# Patient Record
Sex: Female | Born: 1970 | Race: White | Hispanic: No | Smoking: Current every day smoker
Health system: Southern US, Community
[De-identification: ages and names within clinical notes are randomized; demographics above are authoritative.]

## PROBLEM LIST (undated history)

## (undated) DIAGNOSIS — N809 Endometriosis, unspecified: Secondary | ICD-10-CM

## (undated) DIAGNOSIS — M810 Age-related osteoporosis without current pathological fracture: Secondary | ICD-10-CM

## (undated) HISTORY — PX: KIDNEY SURGERY: SHX687

## (undated) HISTORY — PX: ABDOMINAL HYSTERECTOMY: SHX81

## (undated) HISTORY — PX: TUBAL LIGATION: SHX77

## (undated) HISTORY — PX: TONSILLECTOMY: SUR1361

---

## 1998-01-02 ENCOUNTER — Emergency Department (HOSPITAL_COMMUNITY): Admission: EM | Admit: 1998-01-02 | Discharge: 1998-01-02 | Payer: Self-pay | Admitting: Emergency Medicine

## 1998-03-07 ENCOUNTER — Other Ambulatory Visit: Admission: RE | Admit: 1998-03-07 | Discharge: 1998-03-07 | Payer: Self-pay | Admitting: Obstetrics and Gynecology

## 1998-03-20 ENCOUNTER — Other Ambulatory Visit: Admission: RE | Admit: 1998-03-20 | Discharge: 1998-03-20 | Payer: Self-pay | Admitting: Obstetrics and Gynecology

## 1998-09-10 ENCOUNTER — Encounter: Payer: Self-pay | Admitting: Emergency Medicine

## 1998-09-10 ENCOUNTER — Emergency Department (HOSPITAL_COMMUNITY): Admission: EM | Admit: 1998-09-10 | Discharge: 1998-09-10 | Payer: Self-pay | Admitting: Emergency Medicine

## 1998-10-20 ENCOUNTER — Emergency Department (HOSPITAL_COMMUNITY): Admission: EM | Admit: 1998-10-20 | Discharge: 1998-10-20 | Payer: Self-pay | Admitting: Emergency Medicine

## 1999-01-25 ENCOUNTER — Other Ambulatory Visit: Admission: RE | Admit: 1999-01-25 | Discharge: 1999-01-25 | Payer: Self-pay | Admitting: Obstetrics and Gynecology

## 1999-02-17 ENCOUNTER — Inpatient Hospital Stay (HOSPITAL_COMMUNITY): Admission: EM | Admit: 1999-02-17 | Discharge: 1999-02-19 | Payer: Self-pay | Admitting: Emergency Medicine

## 1999-03-05 ENCOUNTER — Encounter: Admission: RE | Admit: 1999-03-05 | Discharge: 1999-03-05 | Payer: Self-pay | Admitting: Internal Medicine

## 1999-03-08 ENCOUNTER — Encounter: Payer: Self-pay | Admitting: Emergency Medicine

## 1999-03-08 ENCOUNTER — Emergency Department (HOSPITAL_COMMUNITY): Admission: EM | Admit: 1999-03-08 | Discharge: 1999-03-08 | Payer: Self-pay | Admitting: Emergency Medicine

## 1999-03-14 ENCOUNTER — Emergency Department (HOSPITAL_COMMUNITY): Admission: EM | Admit: 1999-03-14 | Discharge: 1999-03-14 | Payer: Self-pay | Admitting: Emergency Medicine

## 1999-03-14 ENCOUNTER — Encounter: Payer: Self-pay | Admitting: Emergency Medicine

## 1999-03-15 ENCOUNTER — Emergency Department (HOSPITAL_COMMUNITY): Admission: EM | Admit: 1999-03-15 | Discharge: 1999-03-15 | Payer: Self-pay | Admitting: Emergency Medicine

## 1999-03-16 ENCOUNTER — Encounter: Payer: Self-pay | Admitting: Emergency Medicine

## 1999-04-24 ENCOUNTER — Encounter: Admission: RE | Admit: 1999-04-24 | Discharge: 1999-04-24 | Payer: Self-pay | Admitting: Internal Medicine

## 1999-06-20 ENCOUNTER — Ambulatory Visit (HOSPITAL_COMMUNITY): Admission: RE | Admit: 1999-06-20 | Discharge: 1999-06-20 | Payer: Self-pay | Admitting: Obstetrics and Gynecology

## 1999-06-20 ENCOUNTER — Encounter (INDEPENDENT_AMBULATORY_CARE_PROVIDER_SITE_OTHER): Payer: Self-pay

## 1999-07-06 ENCOUNTER — Encounter: Payer: Self-pay | Admitting: Obstetrics and Gynecology

## 1999-07-06 ENCOUNTER — Encounter: Admission: RE | Admit: 1999-07-06 | Discharge: 1999-07-06 | Payer: Self-pay | Admitting: Obstetrics and Gynecology

## 1999-08-13 ENCOUNTER — Encounter: Admission: RE | Admit: 1999-08-13 | Discharge: 1999-08-13 | Payer: Self-pay | Admitting: Internal Medicine

## 1999-08-13 ENCOUNTER — Ambulatory Visit (HOSPITAL_COMMUNITY): Admission: RE | Admit: 1999-08-13 | Discharge: 1999-08-13 | Payer: Self-pay | Admitting: Internal Medicine

## 1999-08-13 ENCOUNTER — Encounter: Payer: Self-pay | Admitting: Internal Medicine

## 1999-08-17 ENCOUNTER — Encounter: Admission: RE | Admit: 1999-08-17 | Discharge: 1999-08-17 | Payer: Self-pay | Admitting: Internal Medicine

## 1999-09-28 ENCOUNTER — Encounter (INDEPENDENT_AMBULATORY_CARE_PROVIDER_SITE_OTHER): Payer: Self-pay

## 1999-09-28 ENCOUNTER — Other Ambulatory Visit: Admission: RE | Admit: 1999-09-28 | Discharge: 1999-09-28 | Payer: Self-pay | Admitting: Obstetrics and Gynecology

## 1999-10-01 ENCOUNTER — Other Ambulatory Visit: Admission: RE | Admit: 1999-10-01 | Discharge: 1999-10-01 | Payer: Self-pay | Admitting: Obstetrics and Gynecology

## 1999-12-12 ENCOUNTER — Observation Stay (HOSPITAL_COMMUNITY): Admission: RE | Admit: 1999-12-12 | Discharge: 1999-12-13 | Payer: Self-pay | Admitting: Obstetrics and Gynecology

## 1999-12-12 ENCOUNTER — Encounter (INDEPENDENT_AMBULATORY_CARE_PROVIDER_SITE_OTHER): Payer: Self-pay

## 1999-12-24 ENCOUNTER — Encounter: Payer: Self-pay | Admitting: Obstetrics and Gynecology

## 1999-12-24 ENCOUNTER — Ambulatory Visit (HOSPITAL_COMMUNITY): Admission: RE | Admit: 1999-12-24 | Discharge: 1999-12-24 | Payer: Self-pay | Admitting: Obstetrics and Gynecology

## 2000-01-15 ENCOUNTER — Ambulatory Visit (HOSPITAL_COMMUNITY): Admission: RE | Admit: 2000-01-15 | Discharge: 2000-01-15 | Payer: Self-pay | Admitting: Obstetrics and Gynecology

## 2000-01-15 ENCOUNTER — Encounter (INDEPENDENT_AMBULATORY_CARE_PROVIDER_SITE_OTHER): Payer: Self-pay | Admitting: Specialist

## 2000-04-08 ENCOUNTER — Emergency Department (HOSPITAL_COMMUNITY): Admission: EM | Admit: 2000-04-08 | Discharge: 2000-04-08 | Payer: Self-pay | Admitting: Emergency Medicine

## 2000-04-08 ENCOUNTER — Encounter: Payer: Self-pay | Admitting: Emergency Medicine

## 2000-04-10 ENCOUNTER — Encounter: Admission: RE | Admit: 2000-04-10 | Discharge: 2000-04-10 | Payer: Self-pay | Admitting: Hematology and Oncology

## 2000-04-14 ENCOUNTER — Emergency Department (HOSPITAL_COMMUNITY): Admission: EM | Admit: 2000-04-14 | Discharge: 2000-04-14 | Payer: Self-pay | Admitting: Emergency Medicine

## 2000-09-08 ENCOUNTER — Encounter: Payer: Self-pay | Admitting: Emergency Medicine

## 2000-09-08 ENCOUNTER — Emergency Department (HOSPITAL_COMMUNITY): Admission: EM | Admit: 2000-09-08 | Discharge: 2000-09-08 | Payer: Self-pay | Admitting: Emergency Medicine

## 2000-10-13 ENCOUNTER — Encounter: Admission: RE | Admit: 2000-10-13 | Discharge: 2000-10-13 | Payer: Self-pay | Admitting: Internal Medicine

## 2000-11-04 ENCOUNTER — Encounter: Admission: RE | Admit: 2000-11-04 | Discharge: 2000-11-04 | Payer: Self-pay | Admitting: Obstetrics & Gynecology

## 2001-04-11 ENCOUNTER — Inpatient Hospital Stay (HOSPITAL_COMMUNITY): Admission: AD | Admit: 2001-04-11 | Discharge: 2001-04-11 | Payer: Self-pay | Admitting: Obstetrics and Gynecology

## 2001-06-05 ENCOUNTER — Inpatient Hospital Stay (HOSPITAL_COMMUNITY): Admission: RE | Admit: 2001-06-05 | Discharge: 2001-06-07 | Payer: Self-pay | Admitting: Obstetrics and Gynecology

## 2001-09-25 ENCOUNTER — Other Ambulatory Visit: Admission: RE | Admit: 2001-09-25 | Discharge: 2001-09-25 | Payer: Self-pay | Admitting: Obstetrics and Gynecology

## 2001-10-30 ENCOUNTER — Encounter: Payer: Self-pay | Admitting: Obstetrics and Gynecology

## 2001-10-30 ENCOUNTER — Encounter: Admission: RE | Admit: 2001-10-30 | Discharge: 2001-10-30 | Payer: Self-pay | Admitting: Obstetrics and Gynecology

## 2001-11-13 ENCOUNTER — Encounter: Payer: Self-pay | Admitting: Obstetrics and Gynecology

## 2001-11-13 ENCOUNTER — Encounter: Admission: RE | Admit: 2001-11-13 | Discharge: 2001-11-13 | Payer: Self-pay | Admitting: Obstetrics and Gynecology

## 2002-07-10 ENCOUNTER — Encounter: Payer: Self-pay | Admitting: Emergency Medicine

## 2002-07-10 ENCOUNTER — Emergency Department (HOSPITAL_COMMUNITY): Admission: EM | Admit: 2002-07-10 | Discharge: 2002-07-10 | Payer: Self-pay | Admitting: Emergency Medicine

## 2002-08-11 ENCOUNTER — Emergency Department (HOSPITAL_COMMUNITY): Admission: EM | Admit: 2002-08-11 | Discharge: 2002-08-11 | Payer: Self-pay | Admitting: Emergency Medicine

## 2002-09-28 ENCOUNTER — Other Ambulatory Visit: Admission: RE | Admit: 2002-09-28 | Discharge: 2002-09-28 | Payer: Self-pay | Admitting: Obstetrics and Gynecology

## 2002-11-10 ENCOUNTER — Encounter: Admission: RE | Admit: 2002-11-10 | Discharge: 2002-11-10 | Payer: Self-pay | Admitting: Internal Medicine

## 2002-11-24 ENCOUNTER — Encounter: Admission: RE | Admit: 2002-11-24 | Discharge: 2002-11-24 | Payer: Self-pay | Admitting: Internal Medicine

## 2002-11-24 ENCOUNTER — Encounter: Payer: Self-pay | Admitting: Internal Medicine

## 2002-11-24 ENCOUNTER — Ambulatory Visit (HOSPITAL_COMMUNITY): Admission: RE | Admit: 2002-11-24 | Discharge: 2002-11-24 | Payer: Self-pay | Admitting: Internal Medicine

## 2004-07-16 ENCOUNTER — Emergency Department (HOSPITAL_COMMUNITY): Admission: EM | Admit: 2004-07-16 | Discharge: 2004-07-16 | Payer: Self-pay | Admitting: Emergency Medicine

## 2004-07-18 ENCOUNTER — Ambulatory Visit: Payer: Self-pay | Admitting: Internal Medicine

## 2004-07-18 ENCOUNTER — Inpatient Hospital Stay (HOSPITAL_COMMUNITY): Admission: EM | Admit: 2004-07-18 | Discharge: 2004-07-20 | Payer: Self-pay | Admitting: Emergency Medicine

## 2004-08-21 ENCOUNTER — Other Ambulatory Visit: Admission: RE | Admit: 2004-08-21 | Discharge: 2004-08-21 | Payer: Self-pay | Admitting: Obstetrics and Gynecology

## 2004-08-28 ENCOUNTER — Encounter: Admission: RE | Admit: 2004-08-28 | Discharge: 2004-08-28 | Payer: Self-pay | Admitting: Obstetrics and Gynecology

## 2005-03-08 ENCOUNTER — Emergency Department (HOSPITAL_COMMUNITY): Admission: EM | Admit: 2005-03-08 | Discharge: 2005-03-08 | Payer: Self-pay | Admitting: Emergency Medicine

## 2005-05-15 ENCOUNTER — Emergency Department (HOSPITAL_COMMUNITY): Admission: EM | Admit: 2005-05-15 | Discharge: 2005-05-15 | Payer: Self-pay | Admitting: Emergency Medicine

## 2005-05-16 ENCOUNTER — Encounter: Admission: RE | Admit: 2005-05-16 | Discharge: 2005-05-16 | Payer: Self-pay | Admitting: Orthopedic Surgery

## 2005-12-10 ENCOUNTER — Other Ambulatory Visit: Admission: RE | Admit: 2005-12-10 | Discharge: 2005-12-10 | Payer: Self-pay | Admitting: Obstetrics and Gynecology

## 2006-11-20 ENCOUNTER — Emergency Department (HOSPITAL_COMMUNITY): Admission: EM | Admit: 2006-11-20 | Discharge: 2006-11-20 | Payer: Self-pay | Admitting: Emergency Medicine

## 2006-12-15 ENCOUNTER — Other Ambulatory Visit: Admission: RE | Admit: 2006-12-15 | Discharge: 2006-12-15 | Payer: Self-pay | Admitting: Obstetrics and Gynecology

## 2007-03-14 ENCOUNTER — Emergency Department (HOSPITAL_COMMUNITY): Admission: EM | Admit: 2007-03-14 | Discharge: 2007-03-14 | Payer: Self-pay | Admitting: Emergency Medicine

## 2007-11-26 ENCOUNTER — Emergency Department (HOSPITAL_COMMUNITY): Admission: EM | Admit: 2007-11-26 | Discharge: 2007-11-26 | Payer: Self-pay | Admitting: Emergency Medicine

## 2009-02-09 ENCOUNTER — Emergency Department (HOSPITAL_COMMUNITY): Admission: EM | Admit: 2009-02-09 | Discharge: 2009-02-09 | Payer: Self-pay | Admitting: Emergency Medicine

## 2010-03-18 ENCOUNTER — Emergency Department (HOSPITAL_COMMUNITY)
Admission: EM | Admit: 2010-03-18 | Discharge: 2010-03-19 | Disposition: A | Discharge status: Home / Self Care | Payer: 59 | Attending: Emergency Medicine | Admitting: Emergency Medicine

## 2010-03-18 DIAGNOSIS — R61 Generalized hyperhidrosis: Secondary | ICD-10-CM | POA: Insufficient documentation

## 2010-03-18 DIAGNOSIS — R10819 Abdominal tenderness, unspecified site: Secondary | ICD-10-CM | POA: Insufficient documentation

## 2010-03-18 DIAGNOSIS — M810 Age-related osteoporosis without current pathological fracture: Secondary | ICD-10-CM | POA: Insufficient documentation

## 2010-03-18 DIAGNOSIS — B9789 Other viral agents as the cause of diseases classified elsewhere: Secondary | ICD-10-CM | POA: Insufficient documentation

## 2010-03-18 DIAGNOSIS — R42 Dizziness and giddiness: Secondary | ICD-10-CM | POA: Insufficient documentation

## 2010-03-18 DIAGNOSIS — R6883 Chills (without fever): Secondary | ICD-10-CM | POA: Insufficient documentation

## 2010-03-18 DIAGNOSIS — R112 Nausea with vomiting, unspecified: Secondary | ICD-10-CM | POA: Insufficient documentation

## 2010-03-18 DIAGNOSIS — R109 Unspecified abdominal pain: Secondary | ICD-10-CM | POA: Insufficient documentation

## 2010-03-19 LAB — CBC
HCT: 43.6 % (ref 36.0–46.0)
MCHC: 34.6 g/dL (ref 30.0–36.0)

## 2010-03-19 LAB — DIFFERENTIAL
Eosinophils Absolute: 0.1 10*3/uL (ref 0.0–0.7)
Monocytes Absolute: 0.4 10*3/uL (ref 0.1–1.0)

## 2010-03-19 LAB — URINALYSIS, ROUTINE W REFLEX MICROSCOPIC
Hgb urine dipstick: NEGATIVE
Ketones, ur: NEGATIVE mg/dL
Protein, ur: NEGATIVE mg/dL
pH: 5.5 (ref 5.0–8.0)

## 2010-03-19 LAB — BASIC METABOLIC PANEL
BUN: 11 mg/dL (ref 6–23)
CO2: 24 mEq/L (ref 19–32)
Calcium: 9.4 mg/dL (ref 8.4–10.5)
Chloride: 106 mEq/L (ref 96–112)
Creatinine, Ser: 0.76 mg/dL (ref 0.4–1.2)
GFR calc Af Amer: >60 mL/min (ref >60)
GFR calc non Af Amer: >60 mL/min (ref >60)
Glucose, Bld: 185 mg/dL — ABNORMAL HIGH (ref 70–99)
Potassium: 3.4 mEq/L — ABNORMAL LOW (ref 3.5–5.1)

## 2010-04-22 LAB — CBC
HCT: 37.8 % (ref 36.0–46.0)
MCHC: 34.9 g/dL (ref 30.0–36.0)
RDW: 12.9 % (ref 11.5–15.5)

## 2010-04-22 LAB — DIFFERENTIAL
Basophils Relative: 1 % (ref 0–1)
Lymphocytes Relative: 41 % (ref 12–46)
Lymphs Abs: 2.1 10*3/uL (ref 0.7–4.0)
Monocytes Absolute: 0.3 10*3/uL (ref 0.1–1.0)
Monocytes Relative: 6 % (ref 3–12)
Neutro Abs: 2.7 10*3/uL (ref 1.7–7.7)
Neutrophils Relative %: 52 % (ref 43–77)

## 2010-04-22 LAB — COMPREHENSIVE METABOLIC PANEL
Albumin: 4 g/dL (ref 3.5–5.2)
Alkaline Phosphatase: 60 U/L (ref 39–117)
Calcium: 9.3 mg/dL (ref 8.4–10.5)
Chloride: 108 mEq/L (ref 96–112)
Sodium: 140 mEq/L (ref 135–145)
Total Bilirubin: 0.6 mg/dL (ref 0.3–1.2)

## 2010-04-22 LAB — POCT CARDIAC MARKERS: CKMB, poc: <1 ng/mL — ABNORMAL LOW (ref 1.0–8.0)

## 2010-04-22 LAB — URINALYSIS, ROUTINE W REFLEX MICROSCOPIC
Ketones, ur: NEGATIVE mg/dL
Protein, ur: NEGATIVE mg/dL
Specific Gravity, Urine: 1.011 (ref 1.005–1.030)
Urobilinogen, UA: 0.2 mg/dL (ref 0.0–1.0)

## 2010-06-22 NOTE — H&P (Signed)
[facility]  Patient:    Jodi Juarez, Jodi Juarez Visit Number: [id number] MRN: [id number]          Service Type: GYN Location: [facility] Attending Physician:  Wandalee Ferdinand Dictated by:   Dagoberto Ligas, M.D. Admit Date:  04/11/2001 Discharge Date: 04/11/2001                           History and Physical  HISTORY OF PRESENT ILLNESS:   This is a 40 year old, gravida 2, para 2, with a known history of endometriosis who presented March 27 complaining of debilitating abdominal pain, right greater than left.  She requested hysterectomy due to debilitating and increasing pelvic pain.  Periods are currently regular.  Gonorrhea and Chlamydia culture were done and were negative.  On December 13, 1999, patient underwent diagnostic/operative laparoscopy with left salpingo-oophorectomy and right ovarian cystectomy along with multiple peritoneal biopsies and lysis of adhesions.  The biopsies did, in fact, return showing endometriosis.  She was hence for laparoscopically assisted vaginal hysterectomy and possible right salpingo-oophorectomy.  MEDICATIONS:                  None.  PAST MEDICAL HISTORY:         Medical - negative.  Surgical - patient is status post right ureteral reimplantation at age 43 for vesicoureteral reflux, BTSP, tonsillectomy.  ALLERGIES:                    IV DYE, PENICILLIN, ASPIRIN.  FAMILY HISTORY:               Positive for breast cancer, ovarian cancer, hypertension, and diabetes.  SOCIAL HISTORY:               Patient smokes approximately one pack of cigarettes per day.  REVIEW OF SYSTEMS:            Noncontributory.  PHYSICAL EXAMINATION:  GENERAL:                      A well-developed, well-nourished, pleasant white female appearing older than stated age.  VITAL SIGNS:                  Stable.  SKIN:                         Warm and dry without lesions.  LYMPH:                        There is no supraclavicular,  cervical, or inguinal adenopathy.  HEENT:                        Normocephalic.  NECK:                         Supple without thyromegaly.  CHEST/LUNGS:                  Clear.  CARDIAC:                      Regular rate and rhythm without murmurs, gallops, or rubs.  BREASTS:                      Exam is deferred.  ABDOMEN:  Soft and nontender without masses or organomegaly.  Bowel sounds are active.  MUSCULOSKELETAL:              Examination reveals full range of motion without edema, cyanosis, or CVA tenderness.  PELVIC:                       Examination is deferred until examination under anesthesia.  IMPRESSION:                   1. Endometriosis.                               2. Status post left salpingo-oophorectomy with                                  right ovarian cystectomy and lysis of                                  adhesions all via laparoscopy.                               3. Pelvic pain - debilitating - probably                                  secondary to #1.                               4. Smoker.                               5. Status post ureteral reimplantation due to                                  reflux at age 89.  PLAN:                         Laparoscopic-assisted vaginal hysterectomy, possible right salpingo-oophorectomy.  Possible laparotomy and total abdominal hysterectomy.  Risks, benefits, complications, and alternatives are fully discussed with the patient.  She states she understands and accepts. Questions invited and answered. Dictated by:   Dagoberto Ligas, M.D. Attending Physician:  Wandalee Ferdinand DD:  06/04/01 TD:  06/04/01 Job: [id number]

## 2010-06-22 NOTE — Op Note (Signed)
[facility]  Patient:    Jodi Juarez, Jodi Juarez                   MRN: [id number] Proc. Date: 01/15/00 Adm. Date:  [id number] Disc. Date: [id number] Attending:  Wandalee Ferdinand                           Operative Report  PREOPERATIVE DIAGNOSIS:       Postcoital and abnormal uterine bleeding, with                               essentially normal sonohysterogram.  Rule                               out structural abnormality.  POSTOPERATIVE DIAGNOSIS:      Postcoital and abnormal uterine bleeding, with                               essentially normal sonohysterogram.  Rule                               out structural abnormality.  Pathology pending.  PROCEDURE:                    1. Diagnostic hysteroscopy.                               2. Dilatation and curettage.  SURGEON:                      Rudy Jew. Ashley Royalty, M.D.  ASSISTANT:                    None.  ANESTHESIA:                   General.  ESTIMATED BLOOD LOSS:         Less than 50 cc.  COMPLICATIONS:                None.  PACKS & DRAINS:               None.  DESCRIPTION OF PROCEDURE:     The patient was taken to the operating room and placed in the dorsal supine position.  After adequate general anesthesia was administered, she was prepped and draped in the usual manner for a vaginal surgery.  A posterior weighted retractor was placed per vagina.  The anterior lip of the cervix was grasped with a single-tooth tenaculum.  The cervix was then dilated to a size 19-French using Shawnie Pons dilators.  The diagnostic hysteroscope was used using Sorbitol as a distention medium.  The left and right tubal ostia were visualized.  The anterior and posterior surfaces of the uterine cavity were easily visualized.  No polyps or fibroids were noted.  No synechiae were noted as well.  This is important, because there was reference made on the sonohysterogram to two possible synechiae during that  study; however, at hysteroscopy these were not confirmed.  The endocervical canal was similarly benign.  Appropriate photos were obtained.  Attention was then turned to the uterine curettage.  A  medium-sized curet was introduced into the uterine cavity.  A four-quadrant curettage was performed. Then a therapeutic curettage was performed.  The hysteroscope was once again introduced.  Hemostasis was noted.  The procedure was terminated.  The vaginal instruments were removed, and the patient was taken to the recovery room in excellent condition. DD:  01/15/00 TD:  01/15/00 Job: [id number]

## 2010-06-22 NOTE — Op Note (Signed)
[facility]  Patient:    Jodi Juarez, Jodi Juarez                   MRN: [id number] Proc. Date: 12/12/99 Adm. Date:  [id number] Attending:  Wandalee Ferdinand                           Operative Report  PREOPERATIVE DIAGNOSES:       1. Pelvic pain.                               2. Right ovarian cyst-persistent.  POSTOPERATIVE DIAGNOSES:      1. Right ovarian cyst.                               2. Dense adhesions from the left colon to the                                  left ovary.                               3. Multiple pigmented lesions-rule out                                  endometriosis.  Pathology pending.  PROCEDURE:                    1. Diagnostic/operative laparoscopy.                               2. Left salpingo-oophorectomy.                               3. Lysis of adhesions.                               4. Right ovarian cystectomy.                               5. Multiple biopsies including right ovary,                                  right uterine cornu, posterior cul-de-sac.  SURGEON:                      Rudy Jew. Ashley Royalty, M.D.  ANESTHESIA:                   General.  ESTIMATED BLOOD LOSS:         100 cc.  COMPLICATIONS:                None.  PACKS AND DRAINS:             Foley.  Sponge, needle, and instrument count reported as correct x 2.  PROCEDURE:  Patient was taken to the operating room and placed in the dorsal supine position.  After adequate general anesthesia was administered she was placed in the lithotomy position and prepped and draped in the usual manner for abdominal and vaginal surgery.  Posterior weighted retractor was placed per vagina.  The anterior lip of the cervix grasped with a single tooth tenaculum.  ______ uterine manipulator was placed per cervix and held in place with a tenaculum.  Red rubber catheter was used to drain the ______ which was left in place after draining.  A  1.5 cm infraumbilical incision was made in the transverse plane.  The size 10/11 laparoscopic Trocar was placed into the abdominal cavity.  The laparoscope was replaced.  No evidence of trauma was noted.  Pneumoperitoneum was created with CO2 and maintained throughout the case.  Next, two 5 mm suprapubic Trocars were placed in the left and right lower quadrants respectively.  Transillumination under direct visualization techniques were used.  At this point the pelvis was thoroughly surveyed.  The uterus was normal size, shape, and contour without evidence of any fibroids.  At the right uterine cornu there was a pigmented lesion consistent with, but not diagnostic of, endometriosis.  The fallopian tubes were normal size, shape, contour, and length bilaterally.  Fimbria were luxurian.  The distal end of the left fallopian tube along with the left ovary were adherent to the left colon.  On the posterior aspect of the ovary the adhesions to the left colon were quite dense.  The ovary itself contained no cysts or evidence of endometriosis.  The right ovary, however, was enlarged at approximately 4 cm in diameter with approximately 2 cm cyst within it.  On the lateral aspect of the ovary there was also a pigmented lesion consistent with, but not diagnostic of, endometriosis.  In addition, the posterior cul-de-sac contained a pigmented lesion consistent with, but not diagnostic of, endometriosis.  The anterior cul-de-sac was clear.  Appropriate photos were obtained.  At this point a third suprapubic Trocar was placed in the midline using identical techniques.                                Attention was next turned to the right ovarian cystectomy.  The ______ laser was employed with the GRP6 probe at 14 watts power in order to circumscribe the cyst.  The capsule was opened and the plane developed between the cyst and the ovary.  The cyst was easily removed and submitted to pathology for  histologic studies.  Next, appropriate biopsies were taken from the afore mentioned areas, each submitted separately to pathology for histologic studies.  A separate specimen of right ovarian cortex was submitted as well.  Hemostasis was obtained with the bipolar cautery.                                Attention was then turned to the left ovary. This was the area where the patient was having the vast majority of her symptoms.  Truly dense adhesions were present between the posterior aspect of the left ovary, distal tube, and the colon.  Using the ______ laser these adhesions were gradually and successfully lysed.  Careful attention was paid to avoid injury to the urinary tract or the colon itself.  There was some bleeding encountered at the proximal (infundibulopelvic) side of the ovary  which prompted the operator to feel that it was in the patients best interest to go ahead and remove the left ovary and fallopian tube.  Hence, the left most suprapubic port was extended and a stapler employed to ligate the left infundibulopelvic ligament and remove the left adnexa.  This was successfully accomplished.  Small bleeders were easily controlled with the bipolar cautery. The stump of the fallopian tube was cauterized with the ______ generator and bipolar Klepinger forceps at a setting of 5 in order to avoid any possible pregnancy through that side.                                Finally, the pelvis was once again surveyed. Copious irrigation was accomplished.  Hemostasis was noted.  Extensive survey was conducted in order to identify the ureters on both sides.  The ureter on the right side was easily seen but due to the patients anatomy with the left colon adjacent to the left pelvic side wall the ureter was not seen as readily on the left side but it was felt to have been seen peristalsing well below the plane of dissection.                                At this point the patient was felt  to have benefited maximally from the surgical procedure.  Abdominal instruments were noted, the pneumoperitoneum evacuated.  Fascial defects were closed with 0 Vicryl in an interrupted fashion.  The skin was closed with 3-0 Chromic in a  subcuticular fashion.  Approximately 10 cc of 0.25% Marcaine was instilled into the incision sites to aid in postoperative analgesia.  The vaginal instruments were removed.  Hemostasis was noted.  Procedure terminated.  At the conclusion of the procedure the urine was clear and copious.                                Patient was then taken to the recovery room in excellent condition.  Due to the late afternoon termination of procedure, it was felt in the patients best interest to proceed with 23 hour observation and thus she will be discharged home in the a.m. barring any complications. DD:  12/12/99 TD:  12/13/99 Job: [id number]

## 2010-06-22 NOTE — Op Note (Signed)
[facility]  Patient:    Jodi Juarez, Jodi Juarez                   MRN: [id number] Proc. Date: 06/20/99 Adm. Date:  [id number] Attending:  Wandalee Ferdinand                           Operative Report  PREOPERATIVE DIAGNOSIS:       Left lower quadrant discomfort.  Right ovarian cystic follicle.  POSTOPERATIVE DIAGNOSIS:      Adhesions of the left colon to the left ovary. Pigmented lesion left ovary.  OPERATION:                    Diagnostic/operative laparoscopy.  Lysis of adhesions.  Left ovarian biopsy.  SURGEON:                      Rudy Jew. Ashley Royalty, M.D.  ASSISTANT:  ANESTHESIA:                   General anesthesia.  ESTIMATED BLOOD LOSS:         Less than 25 cc.  COMPLICATIONS:                None.  PACKS AND DRAINS:             None.  DESCRIPTION OF PROCEDURE:     First the patients operative note from 1976 with respect to the procedure of "right ureteral advancement" was reviewed.  She was  taken to the operating room and placed in the dorsal supine position.  After general anesthesia was administered, she was prepped and draped in the usual fashion for abdominal and vaginal surgery.  Posterior weighted retractor was placed per vagina.  The anterior lip of the cervix was grasped with a single tooth tenaculum.  The Jarcho uterine manipulator was placed per cervix.  The bladder as drained with a red rubber catheter.  Next a 1.5 cm infraumbilical incision was ade in the transverse plane.  A size 10/11 disposable trocar was placed into the abdominal cavity.  Its location was verified by placement of the laparoscope. There was no evidence of any trauma.  The pneumoperitoneum was created with CO2. Next, two 5 mm trocars were placed in the midline and left lower quadrant respectively using transillumination and direct visualization techniques.  There was no evidence of any trauma.  The pelvis was thoroughly surveyed.  The uterus was  normal, size, shape, and contour without evidence of any fibroids or endometriosis.  The fallopian tubes bilaterally were interrupted in the midportion consistent with previous tubal sterilization procedure.  Otherwise, they were normal size, shape, contour, and length with luxuriant fimbria.  The right ovary was approximately .8 cm and seemed to have an ovarian stigma consistent with corpus luteum.  The left ovary was bound posteriorly by adhesions to the left colon which appeared consistent with the patients left lower quadrant discomfort with pelvic manipulation and intercourse.  Appropriate photos were obtained.  Using the YAG  laser with the GRP6 probe at 14 watts of power, these adhesions were lysed without difficulty.  Hemostasis was obtained with minimal use of the bipolar cautery. he Nezhat suction irrigator was used to expose the entire area.  The ureters were identified bilaterally and appeared to be parastalsing normally.  In particular, the left ureter was well below the plane of dissection.  A small  pigmented lesion was noted on the left ovary and this was biopsied separately and submitted to pathology for histologic studies.  No other lesions were noted on the peritoneal surfaces, ovaries, or elsewhere.  At this point, the patient was felt to have benefitted maximally from the patient. Hence, the abdominal instruments were removed and pneumoperitoneum evacuated. Fascial defects were closed with 0 Vicryl in interrupted fashion.  The skin was  closed with 3-0 chromic in a subcuticular fashion.  Vaginal instruments were removed.  Approximately 10 cc of 0.25% Marcaine were instilled into the abdominal incisions.  Hemostasis was noted.  The patient was taken to the recovery room in excellent condition. DD:  06/20/99 TD:  06/21/99 Job: [id number]

## 2010-06-22 NOTE — H&P (Signed)
[facility]  Patient:    Jodi Juarez, Jodi Juarez                   MRN: [id number] Adm. Date:  [id number] Disc. Date: [id number] Attending:  Wandalee Ferdinand                         History and Physical  HISTORY:                      This is a 40 year old gravida 2, para 2, who presents for diagnostic/operative hysteroscopy and a D&C for abnormal uterine bleeding, in the face of an apparently normal sonohysterogram.  The patient states she has experienced three to four months of postcoital bleeding.  Last menstrual period was December 29, 1999, and lasted for approximately 10 days. The sonohysterogram in August revealed two possible synechiae, but no other discrete masses.  PAST MEDICAL HISTORY:         "Kidney."  PAST SURGICAL HISTORY:        Tonsillectomy, right ureteral implantation at age 50, for vesicoureteral reflux.  Postpartum tubal sterilization procedure.  ALLERGIES:                    IV DYE IN X-RAYS, PENICILLIN, ASPIRIN.  FAMILY HISTORY:               Positive for breast and ovarian cancer, hypertension, and diabetes mellitus.  SOCIAL HISTORY:               The patient smokes one pack of cigarettes per day.  She denies significant alcohol use.  REVIEW OF SYSTEMS:            Noncontributory.  PHYSICAL EXAMINATION:  GENERAL:                      A well-developed, well-nourished, pleasant white female, in no acute distress.  VITAL SIGNS:                  Afebrile, vital signs stable.  SKIN:                         Warm and dry without lesions.  NODES:                        There is no supraclavicular, cervical, or inguinal adenopathy.  HEENT:                        Normocephalic.  NECK:                         Supple without thyromegaly.  CHEST:                        Lungs clear.  CARDIAC:                      A regular rate and rhythm, without murmurs, gallops, or rubs.  BREASTS:                       Deferred.  ABDOMEN:                      Soft, nontender, without masses or organomegaly. Bowel sounds are  active.  MUSCULOSKELETAL:              A full range of motion without edema or cyanosis or CVA tenderness.  PELVIC:                       Deferred until examination under anesthesia.  IMPRESSION:                   Postcoital and abnormal uterine bleeding - rule                               out structural abnormality such as polyp or                               fibroid.  PLAN:                         1. Diagnostic/operative hysteroscopy.                               2. Dilatation and curettage.  The risks, benefits, and alternatives were fully discussed with the patient. She states she understands and accepts.  Her questions were invited and answered. DD:  01/15/00 TD:  01/15/00 Job: [id number]

## 2010-06-22 NOTE — Discharge Summary (Signed)
[place]  Patient:    Jodi Juarez, Jodi Juarez                   MRN: [id number] Adm. Date:  [id number] Disc. Date: [id number] Attending:  Wandalee Ferdinand Dictator:   Rene Paci, M.D. CC:         Dewayne Shorter, M.D.                           Discharge Summary  DISCHARGE DIAGNOSES: 1. Acute pyelonephritis. 2. Hypopotassemia secondary to vomiting, secondary to acute pyelonephritis. 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 4. Tobacco abuse.  DISCHARGE MEDICATIONS: 1. Cipro 500mg  p.o. q.12h. x 14 days. 2. Phenergan 25 mg p.o. q.4h. p.r.n. 3. Vicodin one tablet p.o. q.4h. p.r.n.  FOLLOWUP:  Followup scheduled for January 29, with Dr. Felicity Coyer in the [facility].  At this appointment, the patient will be reevaluated for further signs and symptoms of urinary tract infection.  A UA will be performed and the patient will be established for continuity followup.  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  The patient reports being in her usual state of health until this sudden onset of nausea and vomiting around 7 p.m. the day prior to admission.  She noted severe back pain consistent with her previous episodes of pyelonephritis and has been unable to tolerate p.o. except for single Tylenol taken the morning of admission.  REVIEW OF SYSTEMS:  Negative for weight loss, hematemesis, no chest pain, cough or shortness of breath.  No discharge, no hematuria and no lower extremity swelling.  PAST MEDICAL HISTORY: 1. Recurrent pyelonephritis. 2. Congenital ureter defect.  FAMILY HISTORY:  Positive for hypertension in the mother.  Breast cancer in a maternal aunt.  SOCIAL  HISTORY:  She lives with her husband and two children in [place] and works at [employer].  She is a smoker with one pack per day of tobacco.  No alcohol or recreational drugs.  MEDICATIONS:  None, except Tylenol p.r.n.  ALLERGIES:  PENICILLIN, but unknown reaction.  IV CONTRAST, unknown reaction.  PHYSICAL EXAMINATION:  VITAL SIGNS:  Temperature 101.9, blood pressure 112/71, pulse 94, respiratory rate 24.  GENERAL:  She is a thin woman uncomfortable secondary to the pain, lying decubitus on the stretcher.  HEENT:  Unremarkable.  CARDIOVASCULAR:  Regular with normal S1, S2.  No murmurs or rubs.  LUNGS:  Clear to auscultation bilaterally without wheeze or crackle.  ABDOMEN:  Soft with mild diffuse tenderness.  BACK:  Positive CVA tenderness, left much greater than right.  EXTREMITIES:  Warm without edema.  NEUROLOGIC:  There are no gross deficits.  LABORATORY DATA AND X-RAY FINDINGS:  Urine pregnancy test negative. Urinalysis with too numerous to count white blood cells, many bacteria and 6-10 rbcs, positive for nitrite and positive for leukocyte esterase.  WBC 11.2, hemoglobin 13.3.  HOSPITAL COURSE:  The patient was admitted and begun on IV Cipro for her pyelonephritis.  Urine cultures were followed which eventually grew greater than 10 to the 5th colonies of E. coli with pan sensitivity.  She was given Phenergan for the nausea and hydrated with IV fluids.  As her potassium was initially low secondary to the vomiting, this was  replaced.  Once the patient was able to tolerate p.o., she was changed to p.o. Cipro.  She quickly defervesced with the antibiotics and was feeling close to baseline on the day of discharge.  She discharged on a continued two weeks of Cipro antibiotics and instructed to return to the emergency room should dysuria or increasing flank return or should she become unable to tolerate p.o.  Regarding the history of left ureter congenital abnormality,  we were never able to find records regarding this problem and this will need to be looked into for future outpatient followup medical record hunt. DD:  06/29/99 TD:  07/02/99 Job: [id number]

## 2010-06-22 NOTE — H&P (Signed)
[facility]  Patient:    Jodi Juarez, Jodi ([place])                  MRN: [id number] Attending:  Rudy Jew. Ashley Royalty, M.D.                         History and Physical                                This is a 40 year old gravida 2, para 2 who presented for annual examination October 01, 1999.  She stated at that time she had a recent CT scan performed which revealed bilateral ovarian cysts, larger on the left.  Subsequent ultrasound revealed a complex cyst on the right adnexa of 2.0 cm in greatest diameter.  On the left adnexa there was only an apparent cystic follicle.  There were a couple of apparent uterine ______ as well.                                Patient returned October 12 stating that she had experienced significant right lower quadrant discomfort since the previous visit.  She described it as debilitating and requested surgical intervention. Ultrasound was repeated on or about November 16, 1999 and revealed the persistence of a right adnexal cyst at approximately 1.8 cm in greatest diameter with similar proportions and echo free and heterogeneous material present within it.  In addition there was a new 2.2 cm echo free cyst of the left adnexa, possibly physiologic.  Patient is hence for diagnostic/operative laparoscopy due to ovarian cysts and debilitating right lower quadrant discomfort.  MEDICATIONS:                  None.  PAST MEDICAL HISTORY:         Negative.  PAST SURGICAL HISTORY:        Patient status post right ureteral reimplantation at age 56 for vesicoureteral reflux.  ALLERGIES:                    PENICILLIN, ASPIRIN, IV DYE.  SOCIAL HISTORY:               Patient denies use of alcohol.  She smokes approximately one pack of cigarettes per day.  FAMILY HISTORY:               Positive for hypertension, diabetes, breast cancer, and ovarian cancer.  REVIEW OF SYSTEMS:            Noncontributory.  PHYSICAL  EXAMINATION:  GENERAL:                      Well-developed, well-nourished, pleasant white female in no acute distress.  VITAL SIGNS:                  Afebrile.  Vital signs stable.  SKIN:                         Warm and dry without lesions.  LYMPH:                        No supraclavicular, cervical, or inguinal adenopathy.  HEENT:  Normocephalic.  NECK:                         Supple without thyromegaly.  CHEST:                        Lungs are clear.  CARDIAC:                      Regular rate and rhythm without murmurs, gallops, or rubs.  BREASTS:                      Deferred.  ABDOMEN:                      Soft, nontender without masses or organomegaly. Bowel sounds are active.  MUSCULOSKELETAL:              Full range of motion without edema, cyanosis, or CVA tenderness.  PELVIC:                       Deferred until examination under anesthesia.  IMPRESSION:                   1. Right adnexal cyst-persistent.  Differential includes benign ovarian neoplasm, endometriosis, physiologic.                               3. Left adnexal cyst-new.  Differential includes same as above including physiologic.                               3. Right lower quadrant discomfort-debilitating.  Possibly secondary to #1 and/or #2.  Differential also includes gastrointestinal, endometriosis, etc.  PLAN:                         Diagnostic/operative laparoscopy with possible unilateral or bilateral ovarian cystectomy, possible unilateral salpingo-oophorectomy.  Patient also understands the possibility of exploratory laparotomy and agrees to same.  Risks, benefits, complications, and alternatives fully discussed.  Questions invited and answered. DD:  12/12/99 TD:  12/12/99 Job: [id number]

## 2010-10-26 LAB — COMPREHENSIVE METABOLIC PANEL
AST: 20
Albumin: 4.2
Alkaline Phosphatase: 78
BUN: 10
GFR calc non Af Amer: >60
Glucose, Bld: 88
Potassium: 3.8
Sodium: 136
Total Protein: 7.4

## 2010-10-26 LAB — DIFFERENTIAL
Basophils Absolute: 0
Basophils Relative: 1
Eosinophils Absolute: 0
Eosinophils Relative: 0
Lymphocytes Relative: 21
Lymphs Abs: 1.2
Monocytes Absolute: 0.2
Monocytes Relative: 3
Neutro Abs: 4.3
Neutrophils Relative %: 75

## 2010-10-26 LAB — CBC
HCT: 43.8
Hemoglobin: 15.2 — ABNORMAL HIGH
MCHC: 34.6
MCV: 93.5
Platelets: 262
RBC: 4.69
RDW: 13.2
WBC: 5.7

## 2010-10-26 LAB — LIPASE, BLOOD: Lipase: 16

## 2010-11-06 LAB — URINALYSIS, ROUTINE W REFLEX MICROSCOPIC
Ketones, ur: 15 — AB
Nitrite: NEGATIVE
Urobilinogen, UA: 1

## 2010-11-14 LAB — I-STAT 8, (EC8 V) (CONVERTED LAB)
Acid-base deficit: 2
Hemoglobin: 16.3 — ABNORMAL HIGH
Operator id: 285841
Potassium: 4.4
Sodium: 140
TCO2: 26
pH, Ven: 7.326 — ABNORMAL HIGH

## 2010-11-14 LAB — URINALYSIS, ROUTINE W REFLEX MICROSCOPIC
Bilirubin Urine: NEGATIVE
Ketones, ur: NEGATIVE
Nitrite: NEGATIVE
pH: 6.5

## 2010-11-14 LAB — URINE CULTURE

## 2010-11-14 LAB — POCT I-STAT CREATININE
Creatinine, Ser: 0.7
Operator id: 285841

## 2010-11-22 ENCOUNTER — Emergency Department (HOSPITAL_COMMUNITY)
Admission: EM | Admit: 2010-11-22 | Discharge: 2010-11-22 | Disposition: A | Discharge status: Home / Self Care | Payer: Self-pay | Attending: Emergency Medicine | Admitting: Emergency Medicine

## 2010-11-22 ENCOUNTER — Emergency Department (HOSPITAL_COMMUNITY): Payer: Self-pay

## 2010-11-22 DIAGNOSIS — G43909 Migraine, unspecified, not intractable, without status migrainosus: Secondary | ICD-10-CM | POA: Insufficient documentation

## 2010-11-22 DIAGNOSIS — M810 Age-related osteoporosis without current pathological fracture: Secondary | ICD-10-CM | POA: Insufficient documentation

## 2010-11-22 DIAGNOSIS — R599 Enlarged lymph nodes, unspecified: Secondary | ICD-10-CM | POA: Insufficient documentation

## 2010-11-22 DIAGNOSIS — F17200 Nicotine dependence, unspecified, uncomplicated: Secondary | ICD-10-CM | POA: Insufficient documentation

## 2010-11-22 LAB — CBC
MCV: 91.2 fL (ref 78.0–100.0)
Platelets: 191 10*3/uL (ref 150–400)
RBC: 4.87 MIL/uL (ref 3.87–5.11)
WBC: 6.1 10*3/uL (ref 4.0–10.5)

## 2010-11-22 LAB — BASIC METABOLIC PANEL
CO2: 26 mEq/L (ref 19–32)
Chloride: 104 mEq/L (ref 96–112)
Potassium: 3.6 mEq/L (ref 3.5–5.1)
Sodium: 140 mEq/L (ref 135–145)

## 2010-11-22 LAB — DIFFERENTIAL
Basophils Absolute: 0 10*3/uL (ref 0.0–0.1)
Basophils Relative: 1 % (ref 0–1)
Eosinophils Absolute: 0.1 10*3/uL (ref 0.0–0.7)
Lymphs Abs: 2.1 10*3/uL (ref 0.7–4.0)
Neutrophils Relative %: 59 % (ref 43–77)

## 2010-11-22 MED ORDER — GADOBENATE DIMEGLUMINE 529 MG/ML IV SOLN
10.0000 mL | Freq: Once | INTRAVENOUS | Status: AC | PRN
  Administered 2010-11-22: 10 mL via INTRAVENOUS

## 2011-10-31 ENCOUNTER — Other Ambulatory Visit: Payer: Self-pay | Admitting: Podiatry

## 2011-10-31 DIAGNOSIS — S93326A Dislocation of tarsometatarsal joint of unspecified foot, initial encounter: Secondary | ICD-10-CM

## 2011-11-01 ENCOUNTER — Ambulatory Visit
Admission: RE | Admit: 2011-11-01 | Discharge: 2011-11-01 | Disposition: A | Discharge status: Home / Self Care | Payer: 59 | Source: Ambulatory Visit | Attending: Podiatry | Admitting: Podiatry

## 2011-11-01 DIAGNOSIS — S93326A Dislocation of tarsometatarsal joint of unspecified foot, initial encounter: Secondary | ICD-10-CM

## 2011-11-30 ENCOUNTER — Emergency Department (HOSPITAL_COMMUNITY)
Admission: EM | Admit: 2011-11-30 | Discharge: 2011-12-01 | Disposition: A | Discharge status: Home / Self Care | Payer: 59 | Attending: Emergency Medicine | Admitting: Emergency Medicine

## 2011-11-30 ENCOUNTER — Encounter (HOSPITAL_COMMUNITY): Payer: Self-pay | Admitting: *Deleted

## 2011-11-30 DIAGNOSIS — M25579 Pain in unspecified ankle and joints of unspecified foot: Secondary | ICD-10-CM

## 2011-11-30 DIAGNOSIS — F17200 Nicotine dependence, unspecified, uncomplicated: Secondary | ICD-10-CM | POA: Insufficient documentation

## 2011-11-30 DIAGNOSIS — M25569 Pain in unspecified knee: Secondary | ICD-10-CM

## 2011-11-30 DIAGNOSIS — Y929 Unspecified place or not applicable: Secondary | ICD-10-CM | POA: Insufficient documentation

## 2011-11-30 DIAGNOSIS — T148XXA Other injury of unspecified body region, initial encounter: Secondary | ICD-10-CM

## 2011-11-30 DIAGNOSIS — Z8781 Personal history of (healed) traumatic fracture: Secondary | ICD-10-CM | POA: Insufficient documentation

## 2011-11-30 DIAGNOSIS — W1809XA Striking against other object with subsequent fall, initial encounter: Secondary | ICD-10-CM | POA: Insufficient documentation

## 2011-11-30 DIAGNOSIS — Y9301 Activity, walking, marching and hiking: Secondary | ICD-10-CM | POA: Insufficient documentation

## 2011-11-30 DIAGNOSIS — IMO0002 Reserved for concepts with insufficient information to code with codable children: Secondary | ICD-10-CM | POA: Insufficient documentation

## 2011-11-30 NOTE — ED Notes (Signed)
Pt to ED c/o tripping, everting R foot and causing abrasion to R knee.  No obvious deformities to either.  Pt with hx of break to R foot that did not heal correctly and she is due for re/constructive surgery soon.

## 2011-12-01 ENCOUNTER — Emergency Department (HOSPITAL_COMMUNITY): Payer: 59

## 2011-12-01 MED ORDER — HYDROCODONE-ACETAMINOPHEN 5-325 MG PO TABS
1.0000 | ORAL_TABLET | Freq: Once | ORAL | Status: AC
  Administered 2011-12-01: 1 via ORAL
  Filled 2011-12-01: qty 1

## 2011-12-01 MED ORDER — HYDROCODONE-ACETAMINOPHEN 5-325 MG PO TABS: ORAL_TABLET | ORAL | Status: DC

## 2011-12-01 NOTE — Progress Notes (Signed)
Orthopedic Tech Progress Note Patient Details:  Jodi Juarez 12-17-1970 [id number]  Ortho Devices Type of Ortho Device: Knee Sleeve   Haskell Flirt 12/01/2011, 2:17 AM

## 2011-12-01 NOTE — ED Provider Notes (Signed)
History     CSN: [id number]  Arrival date & time 11/30/11  2238   First MD Initiated Contact with Patient 12/01/11 0036      Chief Complaint  Patient presents with  . Foot Pain  . Knee Pain    (Consider location/radiation/quality/duration/timing/severity/associated sxs/prior treatment) HPI Comments: Patient with history of osteoporosis and previous right foot fractures presents after a fall that occurred approximately 3:00p while walking. Patient states that she twisted her right ankle and fell to the ground striking her right knee. Patient sustained an abrasion to her right knee. Patient complains of pain in her right ankle and knee. No treatments prior to arrival. Onset was acute. Course is constant. Movement or palpation makes the pain worse. Nothing makes it better. Patient states that she has an appointment in 3 days to see a foot doctor regarding her previous injuries.   The history is provided by the patient and a relative.    History reviewed. No pertinent past medical history.  Past Surgical History  Procedure Date  . Tonsillectomy   . Abdominal hysterectomy   . Tubal ligation     No family history on file.  History  Substance Use Topics  . Smoking status: Current Every Day Smoker -- 1.5 packs/day    Types: Cigarettes  . Smokeless tobacco: Not on file  . Alcohol Use: No    OB History    Grav Para Term Preterm Abortions TAB SAB Ect Mult Living                  Review of Systems  Constitutional: Negative for activity change.  HENT: Negative for neck pain.   Musculoskeletal: Positive for arthralgias and gait problem. Negative for back pain and joint swelling.  Skin: Negative for wound.  Neurological: Negative for weakness and numbness.    Allergies  Penicillins  Home Medications   Current Outpatient Rx  Name Route Sig Dispense Refill  . CALCIUM CARBONATE 600 MG PO TABS Oral Take 600 mg by mouth daily.    . MELOXICAM 15 MG PO TABS Oral Take 15 mg by  mouth daily.      BP 114/76  Pulse 65  Temp 97.9 F (36.6 C) (Oral)  Resp 18  SpO2 99%  Physical Exam  Nursing note and vitals reviewed. Constitutional: She appears well-developed and well-nourished.  HENT:  Head: Normocephalic and atraumatic.  Eyes: Conjunctivae normal are normal.  Neck: Normal range of motion. Neck supple.  Cardiovascular:  Pulses:      Dorsalis pedis pulses are 2+ on the right side, and 2+ on the left side.       Posterior tibial pulses are 2+ on the right side, and 2+ on the left side.  Musculoskeletal: She exhibits tenderness. She exhibits no edema.       Right hip: Normal.       Right knee: She exhibits decreased range of motion and ecchymosis. She exhibits no swelling. tenderness found. Medial joint line and lateral joint line tenderness noted.       Right ankle: She exhibits normal range of motion and no swelling. tenderness. Lateral malleolus tenderness found. No medial malleolus and no proximal fibula tenderness found.       Right lower leg: Normal.       Right foot: She exhibits decreased range of motion, tenderness and bony tenderness. She exhibits no swelling.       Feet:  Neurological: She is alert.       Distal motor,  sensation, and vascular intact.   Skin: Skin is warm and dry.  Psychiatric: She has a normal mood and affect.    ED Course  Procedures (including critical care time)  Labs Reviewed - No data to display Dg Ankle Complete Right  12/01/2011  *RADIOLOGY REPORT*  Clinical Data: Trauma/fall, ankle pain  RIGHT ANKLE - COMPLETE 3+ VIEW  Comparison: None.  Findings: No fracture or dislocation is seen.  Possible mild widening of the medial ankle mortise, equivocal.  The base of the fifth metatarsal is unremarkable.  Old post-traumatic deformity involving the base of the third and fourth metatarsals.  The visualized soft tissues are unremarkable.  IMPRESSION: No fracture or dislocation is seen.  Possible mild widening of the medial ankle  mortise, which could reflect a ligamentous injury.  Old post-traumatic deformity involving the base of the third and fourth metatarsals.   Original Report Authenticated By: Charline Bills, M.D.    Dg Knee Complete 4 Views Right  12/01/2011  *RADIOLOGY REPORT*  Clinical Data: Trauma/fall, anterior knee pain  RIGHT KNEE - COMPLETE 4+ VIEW  Comparison: None.  Findings: No fracture or dislocation is seen.  The joint spaces are preserved.  Moderate to large suprapatellar knee joint effusion.  IMPRESSION: No fracture or dislocation is seen.  Moderate to large suprapatellar knee joint effusion.   Original Report Authenticated By: Charline Bills, M.D.      1. Knee pain   2. Ankle pain   3. Abrasion     12:44 AM Patient seen and examined. Work-up initiated. Medications ordered.   Vital signs reviewed and are as follows: Filed Vitals:   11/30/11 2245  BP: 114/76  Pulse: 65  Temp: 97.9 F (36.6 C)  Resp: 18   X-ray reviewed by myself. Radiologist report reviewed. No acute process.   Patient counseled on use of narcotic pain medications. Counseled not to combine these medications with others containing tylenol. Urged not to drink alcohol, drive, or perform any other activities that requires focus while taking these medications. The patient verbalizes understanding and agrees with the plan.  Urged follow-up as planned with foot doctor.      MDM  Knee/ankle pain. Neg x-rays. Neurovascularly intact. Conservative mgmt with ortho f/u indicated.         [place], [place] 12/02/11 [phone]

## 2011-12-09 NOTE — ED Provider Notes (Signed)
Medical screening examination/treatment/procedure(s) were performed by non-physician practitioner and as supervising physician I was immediately available for consultation/collaboration.   Garry Nicolini Y. Aprile Dickenson, MD 12/09/11 2119 

## 2012-04-19 ENCOUNTER — Encounter (HOSPITAL_COMMUNITY): Payer: Self-pay | Admitting: Family Medicine

## 2012-04-19 ENCOUNTER — Emergency Department (HOSPITAL_COMMUNITY)
Admission: EM | Admit: 2012-04-19 | Discharge: 2012-04-19 | Disposition: A | Discharge status: Home / Self Care | Payer: Self-pay | Attending: Emergency Medicine | Admitting: Emergency Medicine

## 2012-04-19 DIAGNOSIS — R197 Diarrhea, unspecified: Secondary | ICD-10-CM | POA: Insufficient documentation

## 2012-04-19 DIAGNOSIS — Z9851 Tubal ligation status: Secondary | ICD-10-CM | POA: Insufficient documentation

## 2012-04-19 DIAGNOSIS — Z90710 Acquired absence of both cervix and uterus: Secondary | ICD-10-CM | POA: Insufficient documentation

## 2012-04-19 DIAGNOSIS — Z79899 Other long term (current) drug therapy: Secondary | ICD-10-CM | POA: Insufficient documentation

## 2012-04-19 DIAGNOSIS — R112 Nausea with vomiting, unspecified: Secondary | ICD-10-CM | POA: Insufficient documentation

## 2012-04-19 DIAGNOSIS — F17200 Nicotine dependence, unspecified, uncomplicated: Secondary | ICD-10-CM | POA: Insufficient documentation

## 2012-04-19 LAB — CBC WITH DIFFERENTIAL/PLATELET
Eosinophils Relative: 0 % (ref 0–5)
HCT: 45.8 % (ref 36.0–46.0)
Hemoglobin: 16.7 g/dL — ABNORMAL HIGH (ref 12.0–15.0)
Lymphocytes Relative: 12 % (ref 12–46)
Lymphs Abs: 0.9 10*3/uL (ref 0.7–4.0)
MCV: 90.7 fL (ref 78.0–100.0)
Monocytes Absolute: 0.3 10*3/uL (ref 0.1–1.0)
RBC: 5.05 MIL/uL (ref 3.87–5.11)
WBC: 7.7 10*3/uL (ref 4.0–10.5)

## 2012-04-19 LAB — COMPREHENSIVE METABOLIC PANEL
Albumin: 4.5 g/dL (ref 3.5–5.2)
BUN: 16 mg/dL (ref 6–23)
Chloride: 97 mEq/L (ref 96–112)
Creatinine, Ser: 0.47 mg/dL — ABNORMAL LOW (ref 0.50–1.10)
GFR calc Af Amer: >90 mL/min (ref >90)
Total Bilirubin: 0.9 mg/dL (ref 0.3–1.2)
Total Protein: 8.4 g/dL — ABNORMAL HIGH (ref 6.0–8.3)

## 2012-04-19 LAB — URINALYSIS, ROUTINE W REFLEX MICROSCOPIC
Bilirubin Urine: NEGATIVE
Ketones, ur: NEGATIVE mg/dL
Nitrite: NEGATIVE
Protein, ur: 30 mg/dL — AB
Urobilinogen, UA: 0.2 mg/dL (ref 0.0–1.0)

## 2012-04-19 LAB — POCT I-STAT, CHEM 8
Calcium, Ion: 1.15 mmol/L (ref 1.12–1.23)
Creatinine, Ser: 0.5 mg/dL (ref 0.50–1.10)
Hemoglobin: 13.6 g/dL (ref 12.0–15.0)
Sodium: 143 mEq/L (ref 135–145)
TCO2: 23 mmol/L (ref 0–100)

## 2012-04-19 LAB — URINE MICROSCOPIC-ADD ON

## 2012-04-19 MED ORDER — HYDROCODONE-ACETAMINOPHEN 5-325 MG PO TABS: 2.0000 | ORAL_TABLET | ORAL | Status: DC | PRN

## 2012-04-19 MED ORDER — ONDANSETRON HCL 4 MG/2ML IJ SOLN
INTRAMUSCULAR | Status: AC
  Administered 2012-04-19: 4 mg via INTRAVENOUS
  Filled 2012-04-19: qty 2

## 2012-04-19 MED ORDER — ONDANSETRON HCL 4 MG/2ML IJ SOLN: 4.0000 mg | Freq: Once | INTRAMUSCULAR | Status: AC

## 2012-04-19 MED ORDER — SODIUM CHLORIDE 0.9 % IV BOLUS (SEPSIS)
1000.0000 mL | Freq: Once | INTRAVENOUS | Status: AC
  Administered 2012-04-19: 1000 mL via INTRAVENOUS

## 2012-04-19 MED ORDER — MORPHINE SULFATE 4 MG/ML IJ SOLN
4.0000 mg | Freq: Once | INTRAMUSCULAR | Status: AC
  Administered 2012-04-19: 4 mg via INTRAVENOUS
  Filled 2012-04-19: qty 1

## 2012-04-19 MED ORDER — DIPHENOXYLATE-ATROPINE 2.5-0.025 MG PO TABS: 1.0000 | ORAL_TABLET | Freq: Four times a day (QID) | ORAL | Status: DC | PRN

## 2012-04-19 MED ORDER — PROMETHAZINE HCL 25 MG PO TABS: 25.0000 mg | ORAL_TABLET | Freq: Four times a day (QID) | ORAL | Status: DC | PRN

## 2012-04-19 NOTE — ED Notes (Signed)
Per pt sts generalized abdominal pain, N,V since last night. Pt actively vomiting in triage. Denies blood in vomit. sts also some diarrhea.

## 2012-04-19 NOTE — ED Provider Notes (Addendum)
History     CSN: [id number]  Arrival date & time 04/19/12  1357   First MD Initiated Contact with Patient 04/19/12 1420      Chief Complaint  Patient presents with  . Abdominal Pain    (Consider location/radiation/quality/duration/timing/severity/associated sxs/prior treatment) HPI Comments: Patient presents to the ER for evaluation of nausea, vomiting and diarrhea. Patient reports that symptoms began last night. She says she has been vomiting throughout the night, cannot hold down anything. There is no blood in her vomit or diarrhea. Patient is complaining of cramping pain all over her abdomen which is moderate to severe at times. Worsens when she vomits.  Patient is a 42 y.o. female presenting with abdominal pain.  Abdominal Pain Associated symptoms: diarrhea, nausea and vomiting     History reviewed. No pertinent past medical history.  Past Surgical History  Procedure Laterality Date  . Tonsillectomy    . Abdominal hysterectomy    . Tubal ligation      History reviewed. No pertinent family history.  History  Substance Use Topics  . Smoking status: Current Every Day Smoker -- 1.50 packs/day    Types: Cigarettes  . Smokeless tobacco: Not on file  . Alcohol Use: No    OB History   Grav Para Term Preterm Abortions TAB SAB Ect Mult Living                  Review of Systems  Gastrointestinal: Positive for nausea, vomiting, abdominal pain and diarrhea.  All other systems reviewed and are negative.    Allergies  Penicillins  Home Medications   Current Outpatient Rx  Name  Route  Sig  Dispense  Refill  . calcium carbonate (OS-CAL) 600 MG TABS   Oral   Take 600 mg by mouth daily.         . Cholecalciferol (VITAMIN D PO)   Oral   Take 1 tablet by mouth daily.           BP 119/63  Pulse 65  Temp(Src) 98.9 F (37.2 C) (Oral)  Resp 20  SpO2 100%  Physical Exam  Constitutional: She is oriented to person, place, and time. She appears well-developed  and well-nourished. No distress.  HENT:  Head: Normocephalic and atraumatic.  Right Ear: Hearing normal.  Nose: Nose normal.  Mouth/Throat: Oropharynx is clear and moist and mucous membranes are normal.  Eyes: Conjunctivae and EOM are normal. Pupils are equal, round, and reactive to light.  Neck: Normal range of motion. Neck supple.  Cardiovascular: Normal rate, regular rhythm, S1 normal and S2 normal.  Exam reveals no gallop and no friction rub.   No murmur heard. Pulmonary/Chest: Effort normal and breath sounds normal. No respiratory distress. She exhibits no tenderness.  Abdominal: Soft. Normal appearance and bowel sounds are normal. There is no hepatosplenomegaly. There is generalized tenderness. There is no rebound, no guarding, no tenderness at McBurney's point and negative Murphy's sign. No hernia.  Musculoskeletal: Normal range of motion.  Neurological: She is alert and oriented to person, place, and time. She has normal strength. No cranial nerve deficit or sensory deficit. Coordination normal. GCS eye subscore is 4. GCS verbal subscore is 5. GCS motor subscore is 6.  Skin: Skin is warm, dry and intact. No rash noted. No cyanosis.  Psychiatric: She has a normal mood and affect. Her speech is normal and behavior is normal. Thought content normal.    ED Course  Procedures (including critical care time)  Labs Reviewed  CBC WITH DIFFERENTIAL - Abnormal; Notable for the following:    Hemoglobin 16.7 (*)    MCHC 36.5 (*)    Neutrophils Relative 84 (*)    All other components within normal limits  URINALYSIS, ROUTINE W REFLEX MICROSCOPIC - Abnormal; Notable for the following:    Protein, ur 30 (*)    Leukocytes, UA SMALL (*)    All other components within normal limits  URINE MICROSCOPIC-ADD ON - Abnormal; Notable for the following:    Squamous Epithelial / LPF FEW (*)    All other components within normal limits  POCT I-STAT, CHEM 8 - Abnormal; Notable for the following:     Potassium 6.5 (*)    Glucose, Bld 130 (*)    Calcium, Ion 0.97 (*)    Hemoglobin 17.0 (*)    HCT 50.0 (*)    All other components within normal limits  LIPASE, BLOOD  COMPREHENSIVE METABOLIC PANEL   No results found.   Diagnosis: 1. Nausea and vomiting 2. Diarrhea 3. Abdominal pain    MDM  Patient comes to the ER for evaluation of nausea, vomiting and diarrhea associated with abdominal pain cramping. Symptoms are most consistent with a viral gastroenteritis. Patient had diffuse abdominal pain initially, after IV fluids, Zofran and a small dose of morphine, her symptoms are much improved. Repeat examination reveals that she is resting comfortably. Abdominal exam still benign. She has not had a fever and her white count is normal. Patient will be treated symptomatically, return if her symptoms worsen.  Patient i-STAT potassium was elevated. This is to be secondary to hemolysis. Patient has normal renal function and is now on potassium supplementation. Potassium will be rechecked prior to discharge.   Addendum: The initial repeat was inadvertently run on the same blood, was also hemolyzed. A repeat i-STAT was redrawn and potassium was 4.    Gilda Crease, MD 04/19/12 1557  Gilda Crease, MD 04/19/12 1630

## 2012-04-19 NOTE — ED Notes (Signed)
Critical i-STAT CHEM8+ was shown to Dr. Blinda Leatherwood.

## 2012-04-20 LAB — POCT I-STAT, CHEM 8
Calcium, Ion: 0.97 mmol/L — ABNORMAL LOW (ref 1.12–1.23)
Glucose, Bld: 130 mg/dL — ABNORMAL HIGH (ref 70–99)
HCT: 50 % — ABNORMAL HIGH (ref 36.0–46.0)
Hemoglobin: 17 g/dL — ABNORMAL HIGH (ref 12.0–15.0)

## 2012-09-28 ENCOUNTER — Other Ambulatory Visit: Payer: Self-pay | Admitting: Obstetrics and Gynecology

## 2012-09-28 DIAGNOSIS — Z1231 Encounter for screening mammogram for malignant neoplasm of breast: Secondary | ICD-10-CM

## 2012-10-20 ENCOUNTER — Encounter (HOSPITAL_COMMUNITY): Payer: Self-pay

## 2012-10-20 ENCOUNTER — Ambulatory Visit (HOSPITAL_COMMUNITY)
Admission: RE | Admit: 2012-10-20 | Discharge: 2012-10-20 | Disposition: A | Discharge status: Home / Self Care | Payer: Self-pay | Source: Ambulatory Visit | Attending: Obstetrics and Gynecology | Admitting: Obstetrics and Gynecology

## 2012-10-20 ENCOUNTER — Ambulatory Visit (HOSPITAL_COMMUNITY)
Admission: RE | Admit: 2012-10-20 | Discharge: 2012-10-20 | Disposition: A | Discharge status: Home / Self Care | Payer: 59 | Source: Ambulatory Visit | Attending: Obstetrics and Gynecology | Admitting: Obstetrics and Gynecology

## 2012-10-20 VITALS — BP 98/64 | Temp 98.3°F | Ht 64.0 in | Wt 108.6 lb

## 2012-10-20 DIAGNOSIS — Z1231 Encounter for screening mammogram for malignant neoplasm of breast: Secondary | ICD-10-CM

## 2012-10-20 DIAGNOSIS — Z01419 Encounter for gynecological examination (general) (routine) without abnormal findings: Secondary | ICD-10-CM

## 2012-10-20 HISTORY — DX: Endometriosis, unspecified: N80.9

## 2012-10-20 NOTE — Patient Instructions (Addendum)
Taught Jodi Juarez how to perform BSE and gave educational materials to take home. Let patient know that if today's Pap smear is normal that she does not need any further Pap smears due to her a history of a hysterectomy for benign reasons. Let patient know will follow up with her within the next couple weeks with results by letter or phone. Smoking Cessation discussed with patient.  Jodi Juarez verbalized understanding. Patient escorted to mammography for a screening mammogram.  [place], [name], RN 11:23 AM

## 2012-10-20 NOTE — Progress Notes (Signed)
No complaints today.  Pap Smear:    Pap smear completed today. Patients last Pap smear was in 2007 at Dr. Ashley Royalty office and abnormal per patient. Per patient needed to follow up after last Pap smear but never did. Per patient has a history of one other abnormal Pap smear 23 years ago that required cryotherapy for follow up. Patient stated has a history of a hysterectomy for endometriosis in 2001. Let patient know that if today's Pap smear is normal that she does not need any further Pap smears due to her history of a hysterectomy for benign reasons per BCCCP and ACOG guidelines. No Pap smear results in EPIC.  Physical exam: Breasts Breasts symmetrical. No skin abnormalities bilateral breasts. No nipple retraction bilateral breasts. No nipple discharge bilateral breasts. No lymphadenopathy. No lumps palpated bilateral breasts. Patient complained of left upper outer breast tenderness on exam. Patient escorted to mammography for a screening mammogram.          Pelvic/Bimanual   Ext Genitalia No lesions, no swelling and no discharge observed on external genitalia.         Vagina Vagina pink and normal texture. No lesions or discharge observed in vagina.          Cervix Cervix is absent due to a history of a hysterectomy.    Uterus Uterus is absent due to a history of a hysterectomy.   Adnexae Bilateral ovaries are absent due to a history of a hysterectomy.      Rectovaginal No rectal exam completed today since patient had no rectal complaints. No skin abnormalities observed on exam.

## 2013-01-03 ENCOUNTER — Emergency Department (HOSPITAL_COMMUNITY)
Admission: EM | Admit: 2013-01-03 | Discharge: 2013-01-04 | Disposition: A | Discharge status: Home / Self Care | Payer: Self-pay | Attending: Emergency Medicine | Admitting: Emergency Medicine

## 2013-01-03 ENCOUNTER — Emergency Department (HOSPITAL_COMMUNITY): Payer: Self-pay

## 2013-01-03 ENCOUNTER — Encounter (HOSPITAL_COMMUNITY): Payer: Self-pay | Admitting: Emergency Medicine

## 2013-01-03 DIAGNOSIS — Y9389 Activity, other specified: Secondary | ICD-10-CM | POA: Insufficient documentation

## 2013-01-03 DIAGNOSIS — Z79899 Other long term (current) drug therapy: Secondary | ICD-10-CM | POA: Insufficient documentation

## 2013-01-03 DIAGNOSIS — F17200 Nicotine dependence, unspecified, uncomplicated: Secondary | ICD-10-CM | POA: Insufficient documentation

## 2013-01-03 DIAGNOSIS — Z880 Allergy status to penicillin: Secondary | ICD-10-CM | POA: Insufficient documentation

## 2013-01-03 DIAGNOSIS — Z8742 Personal history of other diseases of the female genital tract: Secondary | ICD-10-CM | POA: Insufficient documentation

## 2013-01-03 DIAGNOSIS — S93409A Sprain of unspecified ligament of unspecified ankle, initial encounter: Secondary | ICD-10-CM | POA: Insufficient documentation

## 2013-01-03 DIAGNOSIS — Y929 Unspecified place or not applicable: Secondary | ICD-10-CM | POA: Insufficient documentation

## 2013-01-03 DIAGNOSIS — X500XXA Overexertion from strenuous movement or load, initial encounter: Secondary | ICD-10-CM | POA: Insufficient documentation

## 2013-01-03 DIAGNOSIS — Z8739 Personal history of other diseases of the musculoskeletal system and connective tissue: Secondary | ICD-10-CM | POA: Insufficient documentation

## 2013-01-03 DIAGNOSIS — S93402A Sprain of unspecified ligament of left ankle, initial encounter: Secondary | ICD-10-CM

## 2013-01-03 HISTORY — DX: Age-related osteoporosis without current pathological fracture: M81.0

## 2013-01-03 MED ORDER — HYDROCODONE-ACETAMINOPHEN 5-325 MG PO TABS
1.0000 | ORAL_TABLET | Freq: Once | ORAL | Status: AC
  Administered 2013-01-03: 1 via ORAL
  Filled 2013-01-03: qty 1

## 2013-01-03 NOTE — ED Notes (Signed)
Pt arrived to the ED with a complaint of a flall and left foot and ankle pain.  Pt states she doesn't remember how the fall happened but that she stepped off her porch and missed a step and fell landing on her side.  Pt denies any LOC.

## 2013-01-03 NOTE — ED Provider Notes (Signed)
CSN: [id number]     Arrival date & time 01/03/13  2239 History   First MD Initiated Contact with Patient 01/03/13 2255     Chief Complaint  Patient presents with  . Fall   (Consider location/radiation/quality/duration/timing/severity/associated sxs/prior Treatment) HPI Comments: Patient with history of osteoporosis presents with complaint of left ankle pain which began acutely 3 hours ago when she stepped off of a porch step and twisted her left ankle. Pain currently radiates up her leg. She has been able to take a couple of steps on the foot with much pain. She denies other injury during the fall and did not hit her head or lose consciousness. No treatments prior to arrival.  Patient is a 42 y.o. female presenting with fall. The history is provided by the patient.  Fall Associated symptoms include arthralgias. Pertinent negatives include no headaches, joint swelling, neck pain, numbness or weakness.    Past Medical History  Diagnosis Date  . Endometriosis   . Osteoporosis    Past Surgical History  Procedure Laterality Date  . Tonsillectomy    . Abdominal hysterectomy    . Tubal ligation    . Kidney surgery     Family History  Problem Relation Age of Onset  . Cancer Mother     lung  . Breast cancer Paternal Aunt    History  Substance Use Topics  . Smoking status: Current Every Day Smoker -- 0.50 packs/day for 18 years    Types: Cigarettes  . Smokeless tobacco: Never Used  . Alcohol Use: No   OB History   Grav Para Term Preterm Abortions TAB SAB Ect Mult Living   4 2 2  2  2   2      Review of Systems  Constitutional: Negative for activity change.  Musculoskeletal: Positive for arthralgias and gait problem. Negative for back pain, joint swelling and neck pain.  Skin: Negative for wound.  Neurological: Negative for weakness, numbness and headaches.    Allergies  Penicillins  Home Medications   Current Outpatient Rx  Name  Route  Sig  Dispense  Refill  .  calcium carbonate (OS-CAL) 600 MG TABS   Oral   Take 600 mg by mouth daily.         . Cholecalciferol (VITAMIN D PO)   Oral   Take 1 tablet by mouth daily.         . diphenoxylate-atropine (LOMOTIL) 2.5-0.025 MG per tablet   Oral   Take 1 tablet by mouth 4 (four) times daily as needed for diarrhea or loose stools.   30 tablet   0   . HYDROcodone-acetaminophen (NORCO/VICODIN) 5-325 MG per tablet   Oral   Take 2 tablets by mouth every 4 (four) hours as needed for pain.   10 tablet   0   . promethazine (PHENERGAN) 25 MG tablet   Oral   Take 1 tablet (25 mg total) by mouth every 6 (six) hours as needed for nausea.   30 tablet   0    BP 132/71  Pulse 70  Temp(Src) 98.3 F (36.8 C) (Oral)  Resp 18  SpO2 100% Physical Exam  Nursing note and vitals reviewed. Constitutional: She appears well-developed and well-nourished.  HENT:  Head: Normocephalic and atraumatic.  Eyes: Conjunctivae are normal.  Neck: Normal range of motion. Neck supple.  Cardiovascular:  Pulses:      Dorsalis pedis pulses are 2+ on the right side, and 2+ on the left side.  Posterior tibial pulses are 2+ on the right side, and 2+ on the left side.  Musculoskeletal: She exhibits tenderness. She exhibits no edema.  Patient complains of pain with palpation of the medial and lateral left ankle. She denies pain with palpation over the fibular head of the affected side. She denies pain in the hip of the affected side.  Neurological: She is alert.  Distal motor, sensation, and vascular intact.   Skin: Skin is warm and dry.  Psychiatric: She has a normal mood and affect.    ED Course  Procedures (including critical care time) Labs Review Labs Reviewed - No data to display Imaging Review Dg Ankle Complete Left  01/04/2013   CLINICAL DATA:  Trauma with lateral pain and swelling.  EXAM: LEFT ANKLE COMPLETE - 3+ VIEW  COMPARISON:  None.  FINDINGS: Mild osteopenia suspected. No acute fracture or  dislocation. Base of fifth metatarsal and talar dome intact.  IMPRESSION: No acute osseous abnormality.   Electronically Signed   By: Jeronimo Greaves M.D.   On: 01/04/2013 00:26    EKG Interpretation   None      11:05 PM Patient seen and examined. Work-up initiated. Medications ordered.   Vital signs reviewed and are as follows: Filed Vitals:   01/03/13 2247  BP: 132/71  Pulse: 70  Temp: 98.3 F (36.8 C)  Resp: 18   12:43 AM x-ray reviewed by myself. Patient informed of results. Counseled on rice protocol. Will give crutches and ASO. Urged orthopedic followup if not much improved in the next week.  Patient counseled on use of narcotic pain medications. Counseled not to combine these medications with others containing tylenol. Urged not to drink alcohol, drive, or perform any other activities that requires focus while taking these medications. The patient verbalizes understanding and agrees with the plan.   MDM   1. Ankle sprain, left, initial encounter    Ankle injury, LE neurovascularly intact, xray neg. RICE, ortho f/u if needed.    Renne Crigler, PA-C 01/04/13 [phone]

## 2013-01-04 MED ORDER — NAPROXEN 500 MG PO TABS: 500.0000 mg | ORAL_TABLET | Freq: Two times a day (BID) | ORAL | Status: DC

## 2013-01-04 MED ORDER — TRAMADOL HCL 50 MG PO TABS: 50.0000 mg | ORAL_TABLET | Freq: Four times a day (QID) | ORAL | Status: DC | PRN

## 2013-01-04 NOTE — ED Provider Notes (Signed)
Medical screening examination/treatment/procedure(s) were performed by non-physician practitioner and as supervising physician I was immediately available for consultation/collaboration.  EKG Interpretation   None         Jodi Juarez. Oletta Lamas, MD 01/04/13 [phone]

## 2013-12-06 ENCOUNTER — Encounter (HOSPITAL_COMMUNITY): Payer: Self-pay | Admitting: Emergency Medicine

## 2014-05-19 IMAGING — CR DG ANKLE COMPLETE 3+V*L*
3 series · 3 of 3 positions shown · non-contrast
Comparison: None.

CLINICAL DATA: Trauma with lateral pain and swelling.

EXAM:
LEFT ANKLE COMPLETE - 3+ VIEW

[x ankle ap left]
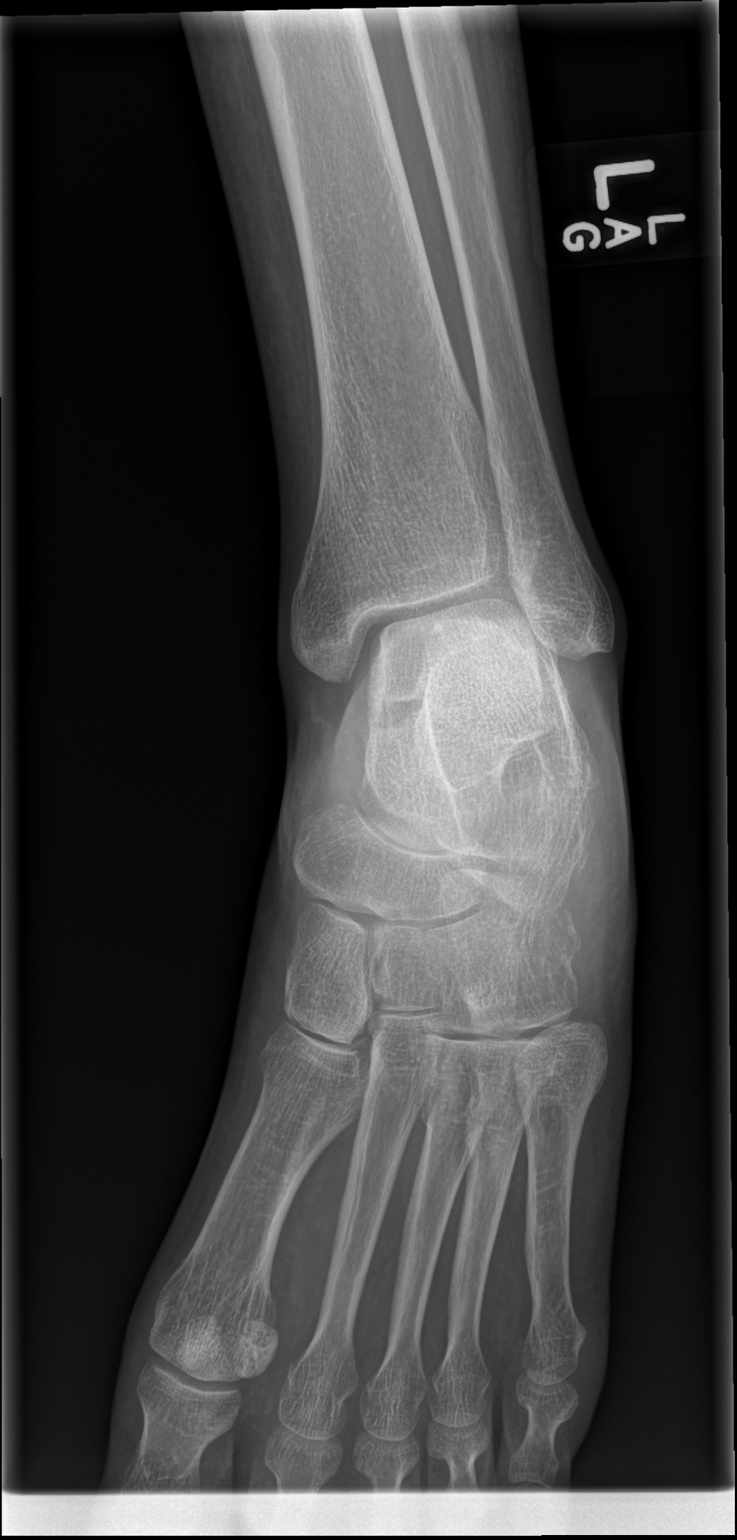

[x ankle obl left]
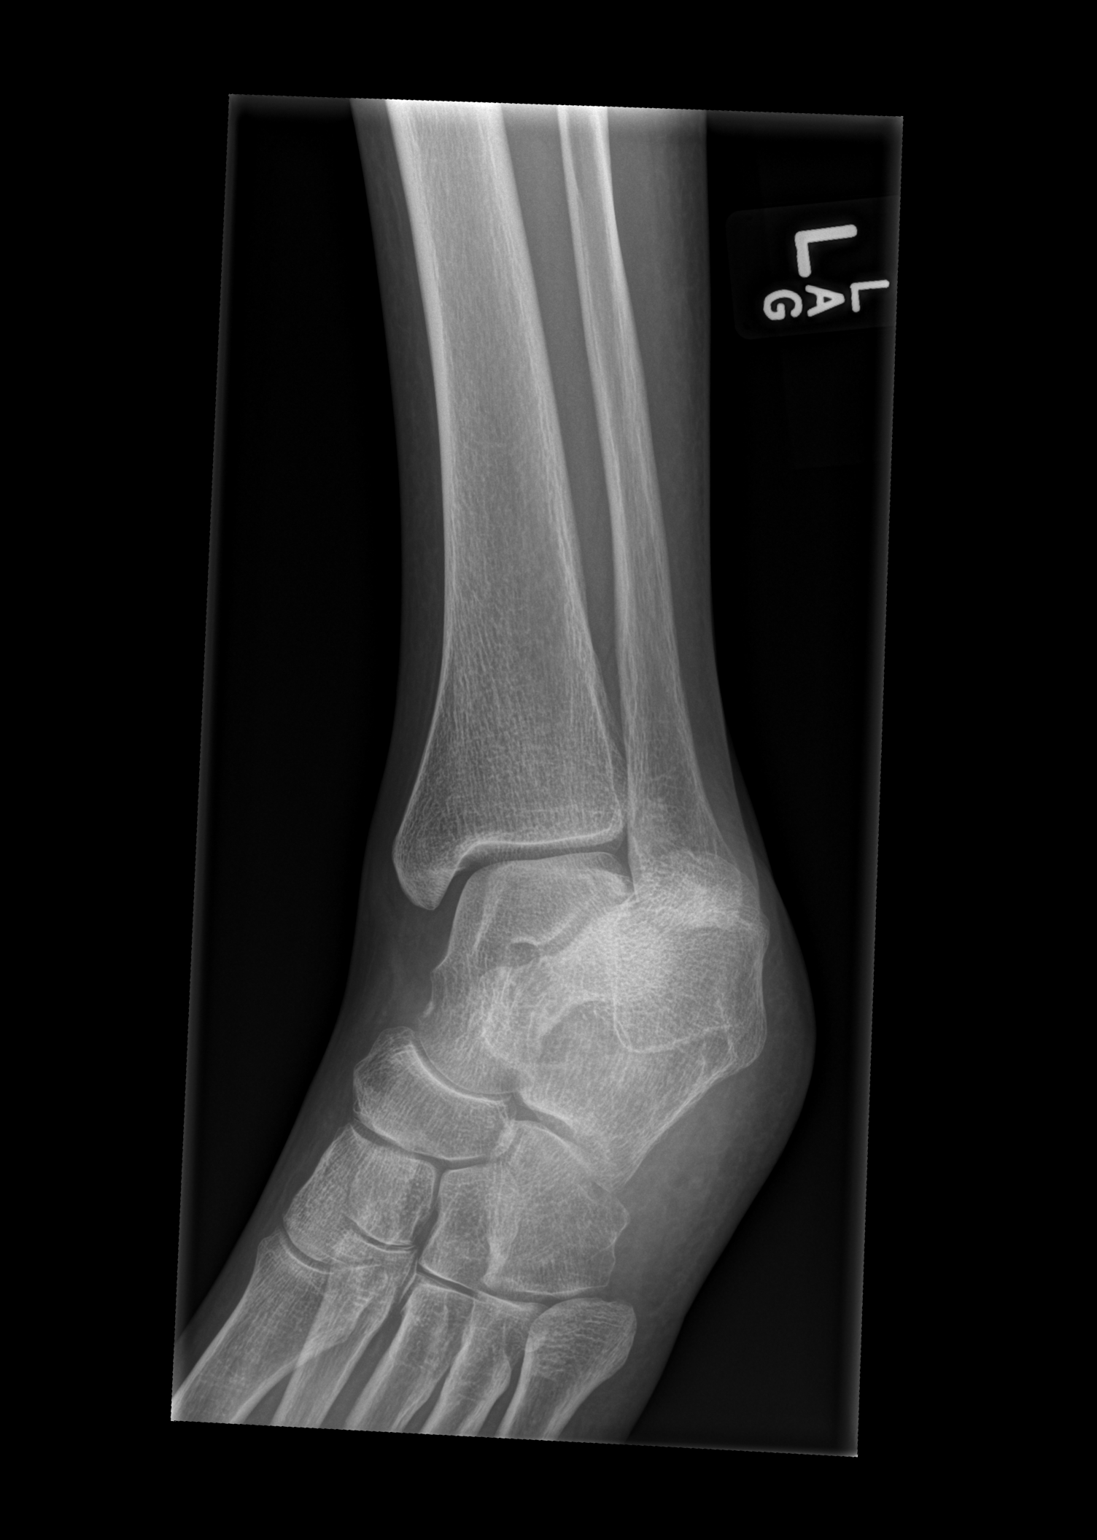

[x ankle lat left]
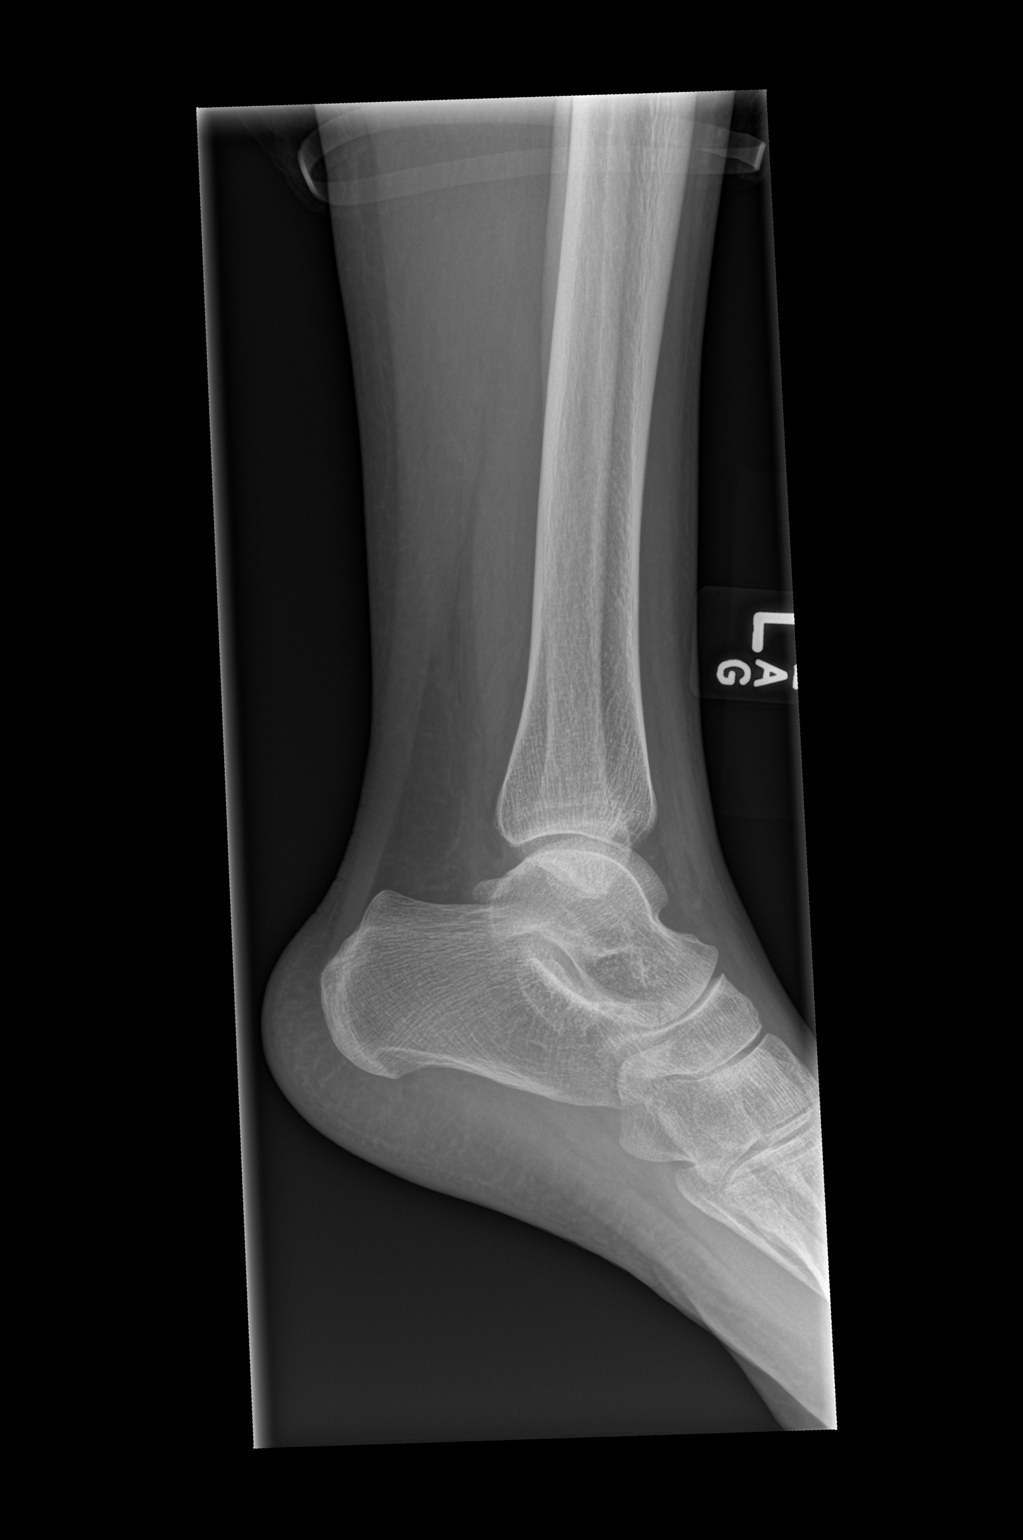

[3 of 3 positions shown; findings below may reference images not displayed]

FINDINGS: Mild osteopenia suspected. No acute fracture or dislocation. Base of
fifth metatarsal and talar dome intact.
IMPRESSION: No acute osseous abnormality.

## 2017-01-01 ENCOUNTER — Encounter (HOSPITAL_COMMUNITY): Payer: Self-pay

## 2019-12-08 ENCOUNTER — Other Ambulatory Visit: Payer: Self-pay | Admitting: Legal Medicine

## 2022-09-10 ENCOUNTER — Encounter (HOSPITAL_COMMUNITY): Payer: Self-pay | Admitting: *Deleted

## 2022-09-10 ENCOUNTER — Other Ambulatory Visit: Payer: Self-pay

## 2022-09-10 ENCOUNTER — Ambulatory Visit (HOSPITAL_COMMUNITY)
Admission: EM | Admit: 2022-09-10 | Discharge: 2022-09-10 | Disposition: A | Discharge status: Home / Self Care | Payer: Commercial Managed Care - PPO | Attending: Nurse Practitioner | Admitting: Nurse Practitioner

## 2022-09-10 DIAGNOSIS — N3001 Acute cystitis with hematuria: Secondary | ICD-10-CM | POA: Insufficient documentation

## 2022-09-10 LAB — POCT URINALYSIS DIP (MANUAL ENTRY)
Bilirubin, UA: NEGATIVE
Glucose, UA: NEGATIVE mg/dL
Ketones, POC UA: NEGATIVE mg/dL
Nitrite, UA: POSITIVE — AB
Protein Ur, POC: NEGATIVE mg/dL
Spec Grav, UA: 1.015 (ref 1.010–1.025)
Urobilinogen, UA: 0.2 E.U./dL
pH, UA: 6.5 (ref 5.0–8.0)

## 2022-09-10 MED ORDER — PHENAZOPYRIDINE HCL 100 MG PO TABS: 100.0000 mg | ORAL_TABLET | Freq: Three times a day (TID) | ORAL | 0 refills | Status: DC | PRN

## 2022-09-10 MED ORDER — NITROFURANTOIN MONOHYD MACRO 100 MG PO CAPS: 100.0000 mg | ORAL_CAPSULE | Freq: Two times a day (BID) | ORAL | 0 refills | Status: AC

## 2022-09-10 NOTE — ED Triage Notes (Signed)
Pt reports since Sunday she has had dysuria and frequency.

## 2022-09-10 NOTE — Discharge Instructions (Signed)
Take the Macrobid twice daily for 5 days to treat the UTI.  We will contact you if the urine culture shows we need to change the antibiotic later this week.  Make sure you are drinking plenty of water.  You can take the Pyridium every 8 hours as needed for bladder pain.  This will dye your urine bright orange.  If you develop fever, nausea/vomiting and are unable to keep fluids down, please go to the emergency room.

## 2022-09-10 NOTE — ED Provider Notes (Signed)
[facility]    CSN: [id number] Arrival date & time: 09/10/22  0802      History   Chief Complaint Chief Complaint  Patient presents with   Urinary Frequency   Dysuria    HPI Jodi Juarez is a 52 y.o. female.   Patient presents today with 2-day history of dysuria, urinary frequency and urgency, voiding small amounts, hematuria.  Patient denies foul urinary odor, abdominal pain or flank pain, suprapubic pain or pressure, fever, nausea/vomiting, and vaginal discharge.  Reports history of UTI years ago.  No antibiotic use in the past 90 days.  Has not taken anything for symptoms so far.    Past Medical History:  Diagnosis Date   Endometriosis    Osteoporosis     There are no problems to display for this patient.   Past Surgical History:  Procedure Laterality Date   ABDOMINAL HYSTERECTOMY     KIDNEY SURGERY     TONSILLECTOMY     TUBAL LIGATION      OB History     Gravida  4   Para  2   Term  2   Preterm      AB  2   Living  2      SAB  2   IAB      Ectopic      Multiple      Live Births               Home Medications    Prior to Admission medications   Medication Sig Start Date End Date Taking? Authorizing Provider  nitrofurantoin, macrocrystal-monohydrate, (MACROBID) 100 MG capsule Take 1 capsule (100 mg total) by mouth 2 (two) times daily for 5 days. 09/10/22 09/15/22 Yes Valentino Nose, NP  phenazopyridine (PYRIDIUM) 100 MG tablet Take 1 tablet (100 mg total) by mouth 3 (three) times daily as needed for pain (bladder pain). 09/10/22  Yes Valentino Nose, NP    Family History Family History  Problem Relation Age of Onset   Cancer Mother        lung   Breast cancer Paternal Aunt     Social History Social History   Tobacco Use   Smoking status: Every Day    Current packs/day: 0.50    Average packs/day: 0.5 packs/day for 18.0 years (9.0 ttl pk-yrs)    Types: Cigarettes   Smokeless tobacco: Never  Substance  Use Topics   Alcohol use: No   Drug use: No    Types: Marijuana     Allergies   Penicillins   Review of Systems Review of Systems Per HPI  Physical Exam Triage Vital Signs ED Triage Vitals  Encounter Vitals Group     BP 09/10/22 0822 134/83     Systolic BP Percentile --      Diastolic BP Percentile --      Pulse Rate 09/10/22 0822 88     Resp 09/10/22 0822 18     Temp 09/10/22 0822 98 F (36.7 C)     Temp src --      SpO2 09/10/22 0822 97 %     Weight --      Height --      Head Circumference --      Peak Flow --      Pain Score 09/10/22 0820 4     Pain Loc --      Pain Education --      Exclude from Growth Chart --  No data found.  Updated Vital Signs BP 134/83   Pulse 88   Temp 98 F (36.7 C)   Resp 18   SpO2 97%   Visual Acuity Right Eye Distance:   Left Eye Distance:   Bilateral Distance:    Right Eye Near:   Left Eye Near:    Bilateral Near:     Physical Exam Vitals and nursing note reviewed.  Constitutional:      General: She is not in acute distress.    Appearance: She is not toxic-appearing.  Cardiovascular:     Rate and Rhythm: Normal rate and regular rhythm.  Pulmonary:     Effort: Pulmonary effort is normal. No respiratory distress.  Abdominal:     General: Abdomen is flat. Bowel sounds are normal. There is no distension.     Palpations: Abdomen is soft. There is no mass.     Tenderness: There is no abdominal tenderness. There is left CVA tenderness (mild per patient). There is no right CVA tenderness or guarding.  Skin:    General: Skin is warm and dry.     Coloration: Skin is not jaundiced or pale.     Findings: No erythema.  Neurological:     Mental Status: She is alert and oriented to person, place, and time.     Motor: No weakness.     Gait: Gait normal.  Psychiatric:        Behavior: Behavior is cooperative.      UC Treatments / Results  Labs (all labs ordered are listed, but only abnormal results are  displayed) Labs Reviewed  POCT URINALYSIS DIP (MANUAL ENTRY) - Abnormal; Notable for the following components:      Result Value   Clarity, UA cloudy (*)    Blood, UA trace-intact (*)    Nitrite, UA Positive (*)    Leukocytes, UA Trace (*)    All other components within normal limits  URINE CULTURE  POCT URINALYSIS DIP (MANUAL ENTRY)    EKG   Radiology No results found.  Procedures Procedures (including critical care time)  Medications Ordered in UC Medications - No data to display  Initial Impression / Assessment and Plan / UC Course  I have reviewed the triage vital signs and the nursing notes.  Pertinent labs & imaging results that were available during my care of the patient were reviewed by me and considered in my medical decision making (see chart for details).   Patient is well-appearing, normotensive, afebrile, not tachycardic, not tachypneic, oxygenating well on room air.    1. Acute cystitis with hematuria Urinalysis today is positive for nitrites, trace intact blood, trace leukocyte Estrace Urine culture pending Vitals and exam today are reassuring Will treat with Macrobid twice daily for 5 days; patient is allergic to penicillins Start Pyridium as needed for bladder pain Strict ER and return precautions reviewed with patient as she is having left CVA tenderness; recommended evaluation in ER if she develops high fever, nausea/vomiting and unable to keep fluids down  The patient was given the opportunity to ask questions.  All questions answered to their satisfaction.  The patient is in agreement to this plan.    Final Clinical Impressions(s) / UC Diagnoses   Final diagnoses:  Acute cystitis with hematuria     Discharge Instructions      Take the Macrobid twice daily for 5 days to treat the UTI.  We will contact you if the urine culture shows we need to change the  antibiotic later this week.  Make sure you are drinking plenty of water.  You can take the  Pyridium every 8 hours as needed for bladder pain.  This will dye your urine bright orange.  If you develop fever, nausea/vomiting and are unable to keep fluids down, please go to the emergency room.     ED Prescriptions     Medication Sig Dispense Auth. Provider   nitrofurantoin, macrocrystal-monohydrate, (MACROBID) 100 MG capsule Take 1 capsule (100 mg total) by mouth 2 (two) times daily for 5 days. 10 capsule Cathlean Marseilles A, NP   phenazopyridine (PYRIDIUM) 100 MG tablet Take 1 tablet (100 mg total) by mouth 3 (three) times daily as needed for pain (bladder pain). 10 tablet Valentino Nose, NP      PDMP not reviewed this encounter.   Valentino Nose, NP 09/10/22 [phone]

## 2022-09-16 ENCOUNTER — Ambulatory Visit: Payer: Self-pay | Admitting: *Deleted

## 2022-09-16 NOTE — Telephone Encounter (Signed)
Summary: Urination-Blood in urine, frequent uirnation   Pt is calling in because she was seen at the ER and was told she didn't have a UTI after being told she did. Pt says she is still experiencing the same symptoms she had previously such as blood in the urine, frequent urination, and pain during urination and she would like a nurse's advise on what to do.         Chief Complaint: continues with sx or urinating blood, pain and frequency  Symptoms: see above sx. Frequent urination, pain with every void. Pain in vaginal area. Retention reported. Prescribed pyridium  Frequency: 09/13/22 Pertinent Negatives: Patient denies fever no back or abdominal pain,  Disposition: [] ED /[] Urgent Care (no appt availability in office) / [] Appointment(In office/virtual)/ []  [facility]/ [] Home Care/ [] Refused Recommended Disposition /[x] [facility] [place] [facility]/ []  Follow-up with PCP Additional Notes:   Recommended mobile bus . No PCP. Gave # for possible new patient appt for a PCP. Recommended to increase water intake and cranberry juice . [facility] /or ED if sx worsen.       Reason for Disposition  Pain or burning with passing urine  Answer Assessment - Initial Assessment Questions 1. COLOR of URINE: "Describe the color of the urine."  (e.g., tea-colored, pink, red, bloody) "Do you have blood clots in your urine?" (e.g., none, pea, grape, small coin)     Tea colored  2. ONSET: "When did the bleeding start?"      Prior to 09/13/22 3. EPISODES: "How many times has there been blood in the urine?" or "How many times today?"     Every void  4. PAIN with URINATION: "Is there any pain with passing your urine?" If Yes, ask: "How bad is the pain?"  (Scale 1-10; or mild, moderate, severe)    - MILD: Complains slightly about urination hurting.    - MODERATE: Interferes with normal activities.      - SEVERE: Excruciating, unwilling or unable to urinate because of the pain.      Yes pain with urination  in vaginal area 5. FEVER: "Do you have a fever?" If Yes, ask: "What is your temperature, how was it measured, and when did it start?"     Na  6. ASSOCIATED SYMPTOMS: "Are you passing urine more frequently than usual?"     Frequency small amounts of urine 7. OTHER SYMPTOMS: "Do you have any other symptoms?" (e.g., back/flank pain, abdomen pain, vomiting)     Urinates and does not feel empties out. Small amount of blood in urine  8. PREGNANCY: "Is there any chance you are pregnant?" "When was your last menstrual period?"     na  Protocols used: Urine - Blood In-A-AH

## 2022-11-25 ENCOUNTER — Encounter: Payer: Self-pay | Admitting: Nurse Practitioner

## 2022-11-25 ENCOUNTER — Ambulatory Visit: Payer: Commercial Managed Care - PPO | Admitting: Nurse Practitioner

## 2022-11-25 VITALS — BP 132/80 | HR 50 | Temp 97.8°F | Ht 65.0 in | Wt 120.0 lb

## 2022-11-25 DIAGNOSIS — Z0000 Encounter for general adult medical examination without abnormal findings: Secondary | ICD-10-CM | POA: Diagnosis not present

## 2022-11-25 DIAGNOSIS — Z1211 Encounter for screening for malignant neoplasm of colon: Secondary | ICD-10-CM

## 2022-11-25 DIAGNOSIS — Z1231 Encounter for screening mammogram for malignant neoplasm of breast: Secondary | ICD-10-CM

## 2022-11-25 DIAGNOSIS — Z23 Encounter for immunization: Secondary | ICD-10-CM | POA: Diagnosis not present

## 2022-11-25 DIAGNOSIS — Z13220 Encounter for screening for lipoid disorders: Secondary | ICD-10-CM

## 2022-11-25 DIAGNOSIS — Z114 Encounter for screening for human immunodeficiency virus [HIV]: Secondary | ICD-10-CM

## 2022-11-25 DIAGNOSIS — Z720 Tobacco use: Secondary | ICD-10-CM | POA: Diagnosis not present

## 2022-11-25 DIAGNOSIS — Z122 Encounter for screening for malignant neoplasm of respiratory organs: Secondary | ICD-10-CM

## 2022-11-25 DIAGNOSIS — Z1159 Encounter for screening for other viral diseases: Secondary | ICD-10-CM

## 2022-11-25 DIAGNOSIS — M810 Age-related osteoporosis without current pathological fracture: Secondary | ICD-10-CM | POA: Insufficient documentation

## 2022-11-25 LAB — COMPREHENSIVE METABOLIC PANEL
ALT: 5 U/L (ref 0–35)
AST: 10 U/L (ref 0–37)
Albumin: 4.5 g/dL (ref 3.5–5.2)
Alkaline Phosphatase: 71 U/L (ref 39–117)
BUN: 11 mg/dL (ref 6–23)
CO2: 29 meq/L (ref 19–32)
Calcium: 9.7 mg/dL (ref 8.4–10.5)
Chloride: 108 meq/L (ref 96–112)
Creatinine, Ser: 0.72 mg/dL (ref 0.40–1.20)
GFR: 95.05 mL/min (ref >60.00)
Glucose, Bld: 82 mg/dL (ref 70–99)
Potassium: 4.9 meq/L (ref 3.5–5.1)
Sodium: 143 meq/L (ref 135–145)
Total Bilirubin: 0.6 mg/dL (ref 0.2–1.2)
Total Protein: 6.8 g/dL (ref 6.0–8.3)

## 2022-11-25 LAB — CBC
HCT: 46.4 % — ABNORMAL HIGH (ref 36.0–46.0)
Hemoglobin: 15 g/dL (ref 12.0–15.0)
MCHC: 32.4 g/dL (ref 30.0–36.0)
MCV: 96.8 fl (ref 78.0–100.0)
Platelets: 166 10*3/uL (ref 150.0–400.0)
RBC: 4.79 Mil/uL (ref 3.87–5.11)
RDW: 13.8 % (ref 11.5–15.5)
WBC: 5.1 10*3/uL (ref 4.0–10.5)

## 2022-11-25 LAB — LIPID PANEL
Cholesterol: 151 mg/dL (ref 0–200)
HDL: 44.3 mg/dL
LDL Cholesterol: 78 mg/dL (ref 0–99)
NonHDL: 106.99
Total CHOL/HDL Ratio: 3
Triglycerides: 145 mg/dL (ref 0.0–149.0)
VLDL: 29 mg/dL (ref 0.0–40.0)

## 2022-11-25 LAB — URINALYSIS, MICROSCOPIC ONLY
RBC / HPF: NONE SEEN (ref 0–?)
WBC, UA: NONE SEEN (ref 0–?)

## 2022-11-25 LAB — TSH: TSH: 1.43 u[IU]/mL (ref 0.35–5.50)

## 2022-11-25 NOTE — Assessment & Plan Note (Signed)
Discussed age-appropriate immunizations and screening exams.  Did review patient's personal, surgical, social, family history.  Patient is up-to-date on all age-appropriate vaccinations she would like.  Will update tetanus vaccine today.  Did discuss shingles vaccine today.  Patient refused flu vaccine today.  Cologuard placed for CRC screening.  Mammogram placed for breast cancer screening.  DEXA placed in setting of historical diagnosis of osteoporosis and total hysterectomy.  Patient no longer does cervical cancer screening status post total hysterectomy.  Patient was given information at discharge about preventative healthcare maintenance with anticipatory guidance

## 2022-11-25 NOTE — Progress Notes (Signed)
New Patient Office Visit  Subjective    Patient ID: Jodi Juarez, female    DOB: [birth year]  Age: 52 y.o. MRN: [id number]  CC:  Chief Complaint  Patient presents with  . Establish Care    HPI Jodi Juarez presents to establish care   for complete physical and follow up of chronic conditions.  Immunizations: -Tetanus: Completed in unsure, update today   -Influenza: refused  -Shingles: discussed in office  -Pneumonia: too young  -covid: original series   Diet: Fair diet. She will eat approx 1 meal aday. Does snack. She drinks pepesi and sweet tea  Exercise: No regular exercise.  Eye exam:  glasses prn to eye doctor  Dental exam: Needs updating   Colonoscopy: needs updating cologuard ordered Lung Cancer Screening: Completed in amb refer   Pap smear: total hysterectomy   Mammogram: needs updating   Dexa: needs updating.  Patient has a history of a total hysterectomy along with diagnosis of osteoporosis.  Patient states that she was prescribed medicine in the past but was financially unobtainable and did not take it.  Sleep: she will go to bed around 930-10 and will get up at 330a. States she feels rested most of the time. Does snore      Outpatient Encounter Medications as of 11/25/2022  Medication Sig  . phenazopyridine (PYRIDIUM) 100 MG tablet Take 1 tablet (100 mg total) by mouth 3 (three) times daily as needed for pain (bladder pain). (Patient not taking: Reported on 11/25/2022)   No facility-administered encounter medications on file as of 11/25/2022.    Past Medical History:  Diagnosis Date  . Endometriosis   . Osteoporosis     Past Surgical History:  Procedure Laterality Date  . ABDOMINAL HYSTERECTOMY    . KIDNEY SURGERY    . TONSILLECTOMY    . TUBAL LIGATION      Family History  Problem Relation Age of Onset  . Cancer Mother        lung  . High blood pressure Mother   . High blood pressure Father   . Breast cancer Paternal Aunt      Social History   Socioeconomic History  . Marital status: Widowed    Spouse name: Not on file  . Number of children: 2  . Years of education: Not on file  . Highest education level: Not on file  Occupational History  . Not on file  Tobacco Use  . Smoking status: Every Day    Current packs/day: 0.75    Average packs/day: 0.8 packs/day for 30.0 years (22.5 ttl pk-yrs)    Types: Cigarettes  . Smokeless tobacco: Never  . Tobacco comments:    0.5 or full pack a day.   Vaping Use  . Vaping status: Never Used  Substance and Sexual Activity  . Alcohol use: Yes    Comment: occasional  . Drug use: Yes    Types: Marijuana    Comment: daily for her anxiety  . Sexual activity: Not Currently    Birth control/protection: Surgical  Other Topics Concern  . Not on file  Social History Narrative   Fulltime: [employer]    5 grandkids 4 boys and  one girl       Morrie Sheldon (35)      John(34)   Social Determinants of Health   Financial Resource Strain: Not on file  Food Insecurity: Not on file  Transportation Needs: Not on file  Physical Activity: Not on file  Stress:  Not on file  Social Connections: Not on file  Intimate Partner Violence: Not on file    Review of Systems  Constitutional:  Negative for chills and fever.  Respiratory:  Negative for shortness of breath.   Cardiovascular:  Negative for chest pain and leg swelling.  Gastrointestinal:  Negative for abdominal pain, blood in stool, constipation, diarrhea, nausea and vomiting.       BM every other day   Genitourinary:  Negative for dysuria and hematuria.  Neurological:  Negative for tingling and headaches.  Psychiatric/Behavioral:  Negative for hallucinations and suicidal ideas.         Objective    BP 132/80   Pulse (!) 50   Temp 97.8 F (36.6 C) (Oral)   Ht 5\' 5"  (1.651 m)   Wt 120 lb (54.4 kg)   SpO2 98%   BMI 19.97 kg/m   Physical Exam Vitals and nursing note reviewed.  Constitutional:       Appearance: Normal appearance.  HENT:     Right Ear: Tympanic membrane, ear canal and external ear normal.     Left Ear: Tympanic membrane, ear canal and external ear normal.     Mouth/Throat:     Mouth: Mucous membranes are moist.     Pharynx: Oropharynx is clear.  Eyes:     Extraocular Movements: Extraocular movements intact.     Pupils: Pupils are equal, round, and reactive to light.  Cardiovascular:     Rate and Rhythm: Normal rate and regular rhythm.     Pulses: Normal pulses.     Heart sounds: Normal heart sounds.  Pulmonary:     Effort: Pulmonary effort is normal.     Breath sounds: Normal breath sounds.  Abdominal:     General: Bowel sounds are normal. There is no distension.     Palpations: There is no mass.     Tenderness: There is no abdominal tenderness.     Hernia: No hernia is present.  Musculoskeletal:     Right lower leg: No edema.     Left lower leg: No edema.  Lymphadenopathy:     Cervical: No cervical adenopathy.  Skin:    General: Skin is warm.  Neurological:     General: No focal deficit present.     Mental Status: She is alert.     Deep Tendon Reflexes:     Reflex Scores:      Bicep reflexes are 2+ on the right side and 2+ on the left side.      Patellar reflexes are 2+ on the right side and 2+ on the left side.    Comments: Bilateral upper and lower extremity strength 5/5  Psychiatric:        Mood and Affect: Mood normal.        Behavior: Behavior normal.        Thought Content: Thought content normal.        Judgment: Judgment normal.        Assessment & Plan:   Problem List Items Addressed This Visit       Musculoskeletal and Integument   Osteoporosis    Historical diagnosis that required treatment but was financially unobtainable.  Order placed for bone density scan today.  Patient was given information to call and get appointment        Other   Preventative health care - Primary    Discussed age-appropriate immunizations and  screening exams.  Did review patient's personal, surgical, social, family history.  Patient is up-to-date on all age-appropriate vaccinations she would like.  Will update tetanus vaccine today.  Did discuss shingles vaccine today.  Patient refused flu vaccine today.  Cologuard placed for CRC screening.  Mammogram placed for breast cancer screening.  DEXA placed in setting of historical diagnosis of osteoporosis and total hysterectomy.  Patient no longer does cervical cancer screening status post total hysterectomy.  Patient was given information at discharge about preventative healthcare maintenance with anticipatory guidance      Relevant Orders   CBC   Comprehensive metabolic panel   TSH   Tobacco use    History of the same.  Will order LDCT referral along with urine microscopy to rule out microscopic hematuria      Relevant Orders   Urine Microscopic   Other Visit Diagnoses     Encounter for hepatitis C screening test for low risk patient       Relevant Orders   Hepatitis C Antibody   Encounter for screening for HIV       Relevant Orders   HIV antibody (with reflex)   Screening mammogram for breast cancer       Relevant Orders   MM 3D SCREENING MAMMOGRAM BILATERAL BREAST   Screening for colon cancer       Relevant Orders   Cologuard   Screening for lung cancer       Relevant Orders   Ambulatory Referral Lung Cancer Screening [facility]   Screening for lipid disorders       Relevant Orders   DG Bone Density   Lipid panel   Need for Tdap vaccination       Relevant Orders   Tdap vaccine greater than or equal to 7yo IM (Completed)       Return in about 1 year (around 11/25/2023) for CPE and Labs.   Audria Nine, NP

## 2022-11-25 NOTE — Assessment & Plan Note (Signed)
Historical diagnosis that required treatment but was financially unobtainable.  Order placed for bone density scan today.  Patient was given information to call and get appointment

## 2022-11-25 NOTE — Assessment & Plan Note (Signed)
History of the same.  Will order LDCT referral along with urine microscopy to rule out microscopic hematuria

## 2022-11-25 NOTE — Patient Instructions (Signed)
Nice to see you today I will be in touch with the labs once I have them Follow up with me in 1 year sooner if you need me We did update your tetanus vaccine today

## 2022-11-26 LAB — HEPATITIS C ANTIBODY: Hepatitis C Ab: NONREACTIVE

## 2022-11-26 LAB — HIV ANTIBODY (ROUTINE TESTING W REFLEX): HIV 1&2 Ab, 4th Generation: NONREACTIVE

## 2022-11-29 ENCOUNTER — Telehealth: Payer: Self-pay | Admitting: Nurse Practitioner

## 2022-11-29 NOTE — Telephone Encounter (Signed)
Patient called in returning a call she received. Relayed lab result message to patient.

## 2022-12-12 LAB — COLOGUARD: COLOGUARD: POSITIVE — AB

## 2022-12-13 ENCOUNTER — Telehealth: Payer: Self-pay | Admitting: Nurse Practitioner

## 2022-12-13 DIAGNOSIS — R195 Other fecal abnormalities: Secondary | ICD-10-CM

## 2022-12-13 NOTE — Telephone Encounter (Signed)
Patient called in returning a call she received. She stated that she would like to have the colonoscopy done in [place].

## 2022-12-13 NOTE — Telephone Encounter (Signed)
Referral placed.

## 2022-12-20 ENCOUNTER — Ambulatory Visit
Admission: RE | Admit: 2022-12-20 | Discharge: 2022-12-20 | Disposition: A | Discharge status: Home / Self Care | Payer: Commercial Managed Care - PPO | Source: Ambulatory Visit | Attending: Nurse Practitioner

## 2022-12-20 DIAGNOSIS — Z1231 Encounter for screening mammogram for malignant neoplasm of breast: Secondary | ICD-10-CM

## 2022-12-24 NOTE — Addendum Note (Signed)
Addended by: Eden Emms on: 12/24/2022 01:08 PM   Modules accepted: Orders

## 2022-12-24 NOTE — Telephone Encounter (Signed)
Referral has been changed.

## 2022-12-24 NOTE — Telephone Encounter (Signed)
Patent contacted the office regarding this referral, she has changed her mind and says she would now prefer [place]. Could a new referral be placed for an office in [place]?

## 2022-12-25 ENCOUNTER — Other Ambulatory Visit: Payer: Self-pay | Admitting: Nurse Practitioner

## 2022-12-25 DIAGNOSIS — R928 Other abnormal and inconclusive findings on diagnostic imaging of breast: Secondary | ICD-10-CM

## 2022-12-31 ENCOUNTER — Telehealth: Payer: Self-pay

## 2022-12-31 ENCOUNTER — Other Ambulatory Visit: Payer: Self-pay

## 2022-12-31 DIAGNOSIS — R195 Other fecal abnormalities: Secondary | ICD-10-CM

## 2022-12-31 DIAGNOSIS — Z1211 Encounter for screening for malignant neoplasm of colon: Secondary | ICD-10-CM

## 2022-12-31 MED ORDER — NA SULFATE-K SULFATE-MG SULF 17.5-3.13-1.6 GM/177ML PO SOLN: 1.0000 | Freq: Once | ORAL | 0 refills | Status: AC

## 2022-12-31 NOTE — Telephone Encounter (Signed)
Patient called in to schedule colonoscopy.

## 2022-12-31 NOTE — Telephone Encounter (Signed)
Gastroenterology Pre-Procedure Review  Request Date: 01/21/23 Requesting Physician: Dr. Allegra Lai  PATIENT REVIEW QUESTIONS: The patient responded to the following health history questions as indicated:    1. Are you having any GI issues? no.  Positive cologuard.  1st colonoscopy 2. Do you have a personal history of Polyps? no 3. Do you have a family history of Colon Cancer or Polyps? no 4. Diabetes Mellitus? no 5. Joint replacements in the past 12 months?no 6. Major health problems in the past 3 months?no 7. Any artificial heart valves, MVP, or defibrillator?no    MEDICATIONS & ALLERGIES:    Patient reports the following regarding taking any anticoagulation/antiplatelet therapy:   Plavix, Coumadin, Eliquis, Xarelto, Lovenox, Pradaxa, Brilinta, or Effient? no Aspirin? no  Patient confirms/reports the following medications:  Current Outpatient Medications  Medication Sig Dispense Refill   phenazopyridine (PYRIDIUM) 100 MG tablet Take 1 tablet (100 mg total) by mouth 3 (three) times daily as needed for pain (bladder pain). (Patient not taking: Reported on 11/25/2022) 10 tablet 0   No current facility-administered medications for this visit.    Patient confirms/reports the following allergies:  Allergies  Allergen Reactions   Penicillins Nausea And Vomiting    No orders of the defined types were placed in this encounter.   AUTHORIZATION INFORMATION Primary Insurance: 1D#: Group #:  Secondary Insurance: 1D#: Group #:  SCHEDULE INFORMATION: Date: 01/21/24 Time: Location: [facility]

## 2023-01-15 ENCOUNTER — Ambulatory Visit
Admission: RE | Admit: 2023-01-15 | Discharge: 2023-01-15 | Disposition: A | Discharge status: Home / Self Care | Payer: Commercial Managed Care - PPO | Source: Ambulatory Visit | Attending: Nurse Practitioner | Admitting: Nurse Practitioner

## 2023-01-15 ENCOUNTER — Telehealth: Payer: Self-pay | Admitting: Nurse Practitioner

## 2023-01-15 ENCOUNTER — Other Ambulatory Visit: Payer: Self-pay | Admitting: Nurse Practitioner

## 2023-01-15 DIAGNOSIS — N6310 Unspecified lump in the right breast, unspecified quadrant: Secondary | ICD-10-CM

## 2023-01-15 DIAGNOSIS — R928 Other abnormal and inconclusive findings on diagnostic imaging of breast: Secondary | ICD-10-CM

## 2023-01-15 NOTE — Telephone Encounter (Signed)
[facility] called stating she have pt to return on tomorrow, 12/12 for biopsy. Office asked if Jodi Juarez can co sign on order? Office states order is already in Epic. Office mentioned order requires a co sign before pt can attend appt. Call back # [phone]

## 2023-01-15 NOTE — Telephone Encounter (Signed)
Order has been signed.

## 2023-01-16 ENCOUNTER — Ambulatory Visit
Admission: RE | Admit: 2023-01-16 | Discharge: 2023-01-16 | Disposition: A | Discharge status: Home / Self Care | Payer: Commercial Managed Care - PPO | Source: Ambulatory Visit | Attending: Nurse Practitioner | Admitting: Nurse Practitioner

## 2023-01-16 ENCOUNTER — Other Ambulatory Visit: Payer: Self-pay | Admitting: Nurse Practitioner

## 2023-01-16 ENCOUNTER — Inpatient Hospital Stay
Admission: RE | Admit: 2023-01-16 | Discharge: 2023-01-16 | Discharge status: Home / Self Care | Payer: Commercial Managed Care - PPO | Source: Ambulatory Visit | Attending: Nurse Practitioner | Admitting: Nurse Practitioner

## 2023-01-16 DIAGNOSIS — R928 Other abnormal and inconclusive findings on diagnostic imaging of breast: Secondary | ICD-10-CM

## 2023-01-16 DIAGNOSIS — N6310 Unspecified lump in the right breast, unspecified quadrant: Secondary | ICD-10-CM

## 2023-01-16 DIAGNOSIS — N6489 Other specified disorders of breast: Secondary | ICD-10-CM

## 2023-01-21 ENCOUNTER — Ambulatory Visit
Admission: RE | Admit: 2023-01-21 | Discharge: 2023-01-21 | Disposition: A | Discharge status: Home / Self Care | Payer: Commercial Managed Care - PPO | Attending: Gastroenterology | Admitting: Gastroenterology

## 2023-01-21 ENCOUNTER — Encounter: Payer: Self-pay | Admitting: Gastroenterology

## 2023-01-21 ENCOUNTER — Encounter
Admission: RE | Disposition: A | Discharge status: Home / Self Care | Payer: Self-pay | Source: Home / Self Care | Attending: Gastroenterology

## 2023-01-21 ENCOUNTER — Other Ambulatory Visit: Payer: Self-pay

## 2023-01-21 ENCOUNTER — Ambulatory Visit: Payer: Commercial Managed Care - PPO | Admitting: Certified Registered"

## 2023-01-21 DIAGNOSIS — F17210 Nicotine dependence, cigarettes, uncomplicated: Secondary | ICD-10-CM | POA: Diagnosis not present

## 2023-01-21 DIAGNOSIS — D125 Benign neoplasm of sigmoid colon: Secondary | ICD-10-CM

## 2023-01-21 DIAGNOSIS — D124 Benign neoplasm of descending colon: Secondary | ICD-10-CM

## 2023-01-21 DIAGNOSIS — K635 Polyp of colon: Secondary | ICD-10-CM

## 2023-01-21 DIAGNOSIS — R195 Other fecal abnormalities: Secondary | ICD-10-CM | POA: Diagnosis not present

## 2023-01-21 DIAGNOSIS — Z1211 Encounter for screening for malignant neoplasm of colon: Secondary | ICD-10-CM

## 2023-01-21 HISTORY — PX: POLYPECTOMY: SHX5525

## 2023-01-21 HISTORY — PX: COLONOSCOPY WITH PROPOFOL: SHX5780

## 2023-01-21 SURGERY — COLONOSCOPY WITH PROPOFOL
Anesthesia: General

## 2023-01-21 MED ORDER — GLYCOPYRROLATE 0.2 MG/ML IJ SOLN
INTRAMUSCULAR | Status: AC
  Filled 2023-01-21: qty 1

## 2023-01-21 MED ORDER — SODIUM CHLORIDE 0.9 % IV SOLN: INTRAVENOUS | Status: DC

## 2023-01-21 MED ORDER — PHENYLEPHRINE 80 MCG/ML (10ML) SYRINGE FOR IV PUSH (FOR BLOOD PRESSURE SUPPORT)
PREFILLED_SYRINGE | INTRAVENOUS | Status: AC
  Filled 2023-01-21: qty 10

## 2023-01-21 MED ORDER — DEXMEDETOMIDINE HCL IN NACL 80 MCG/20ML IV SOLN
INTRAVENOUS | Status: AC
  Filled 2023-01-21: qty 20

## 2023-01-21 MED ORDER — STERILE WATER FOR IRRIGATION IR SOLN: Status: DC | PRN

## 2023-01-21 MED ORDER — LIDOCAINE HCL (CARDIAC) PF 100 MG/5ML IV SOSY
PREFILLED_SYRINGE | INTRAVENOUS | Status: DC | PRN
  Administered 2023-01-21: 100 mg via INTRAVENOUS

## 2023-01-21 MED ORDER — LIDOCAINE HCL (PF) 2 % IJ SOLN
INTRAMUSCULAR | Status: AC
Start: 2023-01-21 — End: ?
  Filled 2023-01-21: qty 5

## 2023-01-21 MED ORDER — PROPOFOL 10 MG/ML IV BOLUS
INTRAVENOUS | Status: DC | PRN
  Administered 2023-01-21: 160 ug/kg/min via INTRAVENOUS
  Administered 2023-01-21: 80 mg via INTRAVENOUS

## 2023-01-21 MED ORDER — GLYCOPYRROLATE 0.2 MG/ML IJ SOLN
INTRAMUSCULAR | Status: DC | PRN
  Administered 2023-01-21: .2 mg via INTRAVENOUS

## 2023-01-21 NOTE — H&P (Addendum)
Jodi Repress, MD [address]  [address]  [place], [place] [address]  Main: [phone]  Fax: [phone] Pager: [phone]  Primary Care Physician:  Eden Emms, NP Primary Gastroenterologist:  Dr. Arlyss Juarez  Pre-Procedure History & Physical: HPI:  Oakli Zech is a 52 y.o. female is here for an colonoscopy.   Past Medical History:  Diagnosis Date   Endometriosis    Osteoporosis     Past Surgical History:  Procedure Laterality Date   ABDOMINAL HYSTERECTOMY     KIDNEY SURGERY     TONSILLECTOMY     TUBAL LIGATION      Prior to Admission medications   Medication Sig Start Date End Date Taking? Authorizing Provider  phenazopyridine (PYRIDIUM) 100 MG tablet Take 1 tablet (100 mg total) by mouth 3 (three) times daily as needed for pain (bladder pain). Patient not taking: Reported on 11/25/2022 09/10/22   Valentino Nose, NP    Allergies as of 12/31/2022 - Review Complete 12/31/2022  Allergen Reaction Noted   Penicillins Nausea And Vomiting 11/30/2011    Family History  Problem Relation Age of Onset   Cancer Mother        lung   High blood pressure Mother    High blood pressure Father    Breast cancer Paternal Aunt     Social History   Socioeconomic History   Marital status: Widowed    Spouse name: Not on file   Number of children: 2   Years of education: Not on file   Highest education level: Not on file  Occupational History   Not on file  Tobacco Use   Smoking status: Every Day    Current packs/day: 0.75    Average packs/day: 0.8 packs/day for 30.0 years (22.5 ttl pk-yrs)    Types: Cigarettes   Smokeless tobacco: Never   Tobacco comments:    0.5 or full pack a day.   Vaping Use   Vaping status: Never Used  Substance and Sexual Activity   Alcohol use: Yes    Comment: occasional,none last 24hrs   Drug use: Yes    Types: Marijuana    Comment: daily for her anxiety   Sexual activity: Not Currently    Birth  control/protection: Surgical  Other Topics Concern   Not on file  Social History Narrative   Fulltime: [employer]    5 grandkids 4 boys and  one girl       Morrie Sheldon (35)      John(34)   Social Drivers of Corporate investment banker Strain: Not on file  Food Insecurity: Not on file  Transportation Needs: Not on file  Physical Activity: Not on file  Stress: Not on file  Social Connections: Not on file  Intimate Partner Violence: Not on file    Review of Systems: See HPI, otherwise negative ROS  Physical Exam: BP 136/87   Pulse (!) 55   Temp (!) 96.9 F (36.1 C) (Temporal)   Resp 14   Ht 5\' 5"  (1.651 m)   Wt 54.8 kg   SpO2 95%   BMI 20.10 kg/m  General:   Alert,  pleasant and cooperative in NAD Head:  Normocephalic and atraumatic. Neck:  Supple; no masses or thyromegaly. Lungs:  Clear throughout to auscultation.    Heart:  Regular rate and rhythm. Abdomen:  Soft, nontender and nondistended. Normal bowel sounds, without guarding, and without rebound.   Neurologic:  Alert and  oriented x4;  grossly  normal neurologically.  Impression/Plan: Tynlee Hooey is here for an colonoscopy to be performed for colon cancer screening, cologuard positive  Risks, benefits, limitations, and alternatives regarding  colonoscopy have been reviewed with the patient.  Questions have been answered.  All parties agreeable.   Lannette Donath, MD  01/21/2023, 10:36 AM

## 2023-01-21 NOTE — Anesthesia Preprocedure Evaluation (Signed)
Anesthesia Evaluation  Patient identified by MRN, date of birth, ID band Patient awake    Reviewed: Allergy & Precautions, H&P , NPO status , Patient's Chart, lab work & pertinent test results, reviewed documented beta blocker date and time   History of Anesthesia Complications Negative for: history of anesthetic complications  Airway Mallampati: III  TM Distance: >3 FB Neck ROM: full    Dental  (+) Dental Advidsory Given, Edentulous Upper, Edentulous Lower   Pulmonary neg shortness of breath, neg sleep apnea, neg COPD, neg recent URI, Current Smoker   Pulmonary exam normal breath sounds clear to auscultation       Cardiovascular Exercise Tolerance: Good negative cardio ROS Normal cardiovascular exam Rhythm:regular Rate:Normal     Neuro/Psych negative neurological ROS  negative psych ROS   GI/Hepatic negative GI ROS, Neg liver ROS,,,  Endo/Other  negative endocrine ROS    Renal/GU negative Renal ROS  negative genitourinary   Musculoskeletal   Abdominal   Peds  Hematology negative hematology ROS (+)   Anesthesia Other Findings Past Medical History: No date: Endometriosis No date: Osteoporosis   Reproductive/Obstetrics negative OB ROS                             Anesthesia Physical Anesthesia Plan  ASA: 2  Anesthesia Plan: General   Post-op Pain Management:    Induction: Intravenous  PONV Risk Score and Plan: 2 and Propofol infusion and TIVA  Airway Management Planned: Natural Airway and Nasal Cannula  Additional Equipment:   Intra-op Plan:   Post-operative Plan:   Informed Consent: I have reviewed the patients History and Physical, chart, labs and discussed the procedure including the risks, benefits and alternatives for the proposed anesthesia with the patient or authorized representative who has indicated his/her understanding and acceptance.     Dental Advisory  Given  Plan Discussed with: Anesthesiologist, CRNA and Surgeon  Anesthesia Plan Comments:        Anesthesia Quick Evaluation

## 2023-01-21 NOTE — Transfer of Care (Signed)
Immediate Anesthesia Transfer of Care Note  Patient: Jodi Juarez  Procedure(s) Performed: COLONOSCOPY WITH PROPOFOL POLYPECTOMY  Patient Location: Endoscopy Unit  Anesthesia Type:General  Level of Consciousness: drowsy  Airway & Oxygen Therapy: Patient Spontanous Breathing  Post-op Assessment: Report given to RN and Post -op Vital signs reviewed and stable  Post vital signs: Reviewed and stable  Last Vitals:  Vitals Value Taken Time  BP 115/74 1146  Temp 35.8 1146  Pulse 78 1146  Resp 16 1146  SpO2 98 1146    Last Pain:  Vitals:   01/21/23 1022  TempSrc: Temporal  PainSc: 0-No pain         Complications: No notable events documented.

## 2023-01-21 NOTE — Op Note (Signed)
[facility] Patient Name: Jodi Juarez Procedure Date: 01/21/2023 11:12 AM MRN: [id number] Account #: [id number] Date of Birth: 28-Apr-1970 Admit Type: Outpatient Age: 52 Room: [facility] ENDO ROOM 3 Gender: Female Note Status: Finalized Instrument Name: Colonoscope [id number] Procedure:             Colonoscopy Indications:           This is the patient's first colonoscopy, Positive                         Cologuard test Providers:             Toney Reil MD, MD Referring MD:          Genene Churn. Toney Reil (Referring MD) Medicines:             General Anesthesia Complications:         No immediate complications. Estimated blood loss: None. Procedure:             Pre-Anesthesia Assessment:                        - Prior to the procedure, a History and Physical was                         performed, and patient medications and allergies were                         reviewed. The patient is competent. The risks and                         benefits of the procedure and the sedation options and                         risks were discussed with the patient. All questions                         were answered and informed consent was obtained.                         Patient identification and proposed procedure were                         verified by the physician, the nurse, the                         anesthesiologist, the anesthetist and the technician                         in the pre-procedure area in the procedure room in the                         endoscopy suite. Mental Status Examination: alert and                         oriented. Airway Examination: normal oropharyngeal                         airway and neck mobility. Respiratory Examination:  clear to auscultation. CV Examination: normal.                         Prophylactic Antibiotics: The patient does not require                         prophylactic antibiotics. Prior  Anticoagulants: The                         patient has taken no anticoagulant or antiplatelet                         agents. ASA Grade Assessment: II - A patient with mild                         systemic disease. After reviewing the risks and                         benefits, the patient was deemed in satisfactory                         condition to undergo the procedure. The anesthesia                         plan was to use general anesthesia. Immediately prior                         to administration of medications, the patient was                         re-assessed for adequacy to receive sedatives. The                         heart rate, respiratory rate, oxygen saturations,                         blood pressure, adequacy of pulmonary ventilation, and                         response to care were monitored throughout the                         procedure. The physical status of the patient was                         re-assessed after the procedure.                        After obtaining informed consent, the colonoscope was                         passed under direct vision. Throughout the procedure,                         the patient's blood pressure, pulse, and oxygen                         saturations were monitored continuously. The  Colonoscope was introduced through the anus and                         advanced to the the cecum, identified by appendiceal                         orifice and ileocecal valve. The colonoscopy was                         performed without difficulty. The patient tolerated                         the procedure well. The quality of the bowel                         preparation was evaluated using the BBPS [facility] Bowel                         Preparation Scale) with scores of: Right Colon = 3,                         Transverse Colon = 3 and Left Colon = 3 (entire mucosa                         seen well with no residual  staining, small fragments                         of stool or opaque liquid). The total BBPS score                         equals 9. The ileocecal valve, appendiceal orifice,                         and rectum were photographed. Findings:      The perianal and digital rectal examinations were normal. Pertinent       negatives include normal sphincter tone and no palpable rectal lesions.      Six sessile polyps were found in the sigmoid colon and descending colon.       The polyps were 3 to 7 mm in size. These polyps were removed with a cold       snare. Resection and retrieval were complete. Estimated blood loss was       minimal.      The retroflexed view of the distal rectum and anal verge was normal and       showed no anal or rectal abnormalities. Impression:            - Six 3 to 7 mm polyps in the sigmoid colon and in the                         descending colon, removed with a cold snare. Resected                         and retrieved.                        - The distal rectum and anal verge are normal on  retroflexion view. Recommendation:        - Discharge patient to home (with escort).                        - Resume previous diet today.                        - Continue present medications.                        - Await pathology results.                        - Repeat colonoscopy in 3 years for surveillance of                         multiple polyps. Procedure Code(s):     --- Professional ---                        [phone], Colonoscopy, flexible; with removal of                         tumor(s), polyp(s), or other lesion(s) by snare                         technique Diagnosis Code(s):     --- Professional ---                        D12.5, Benign neoplasm of sigmoid colon                        D12.4, Benign neoplasm of descending colon                        R19.5, Other fecal abnormalities CPT copyright 2022 [employer]. All  rights reserved. The codes documented in this report are preliminary and upon coder review may  be revised to meet current compliance requirements. Dr. Libby Maw Toney Reil MD, MD 01/21/2023 11:44:22 AM This report has been signed electronically. Number of Addenda: 0 Note Initiated On: 01/21/2023 11:12 AM Scope Withdrawal Time: 0 hours 17 minutes 31 seconds  Total Procedure Duration: 0 hours 20 minutes 9 seconds  Estimated Blood Loss:  Estimated blood loss: none.      [facility]

## 2023-01-22 ENCOUNTER — Encounter: Payer: Self-pay | Admitting: Gastroenterology

## 2023-01-22 LAB — SURGICAL PATHOLOGY

## 2023-01-23 NOTE — Anesthesia Postprocedure Evaluation (Signed)
Anesthesia Post Note  Patient: Jodi Juarez  Procedure(s) Performed: COLONOSCOPY WITH PROPOFOL POLYPECTOMY  Patient location during evaluation: Endoscopy Anesthesia Type: General Level of consciousness: awake and alert Pain management: pain level controlled Vital Signs Assessment: post-procedure vital signs reviewed and stable Respiratory status: spontaneous breathing, nonlabored ventilation, respiratory function stable and patient connected to nasal cannula oxygen Cardiovascular status: blood pressure returned to baseline and stable Postop Assessment: no apparent nausea or vomiting Anesthetic complications: no   No notable events documented.   Last Vitals:  Vitals:   01/21/23 1145 01/21/23 1155  BP: 117/69 130/79  Pulse: 70 95  Resp: 16 18  Temp: (!) 36 C   SpO2: 99% 99%    Last Pain:  Vitals:   01/22/23 0720  TempSrc:   PainSc: 0-No pain                 Lenard Simmer

## 2023-01-24 ENCOUNTER — Encounter: Payer: Self-pay | Admitting: Gastroenterology

## 2023-07-07 ENCOUNTER — Ambulatory Visit
Admission: RE | Admit: 2023-07-07 | Discharge: 2023-07-07 | Disposition: A | Discharge status: Home / Self Care | Payer: Commercial Managed Care - PPO | Source: Ambulatory Visit | Attending: Nurse Practitioner | Admitting: Nurse Practitioner

## 2023-07-07 DIAGNOSIS — Z13220 Encounter for screening for lipoid disorders: Secondary | ICD-10-CM

## 2023-07-07 DIAGNOSIS — N6489 Other specified disorders of breast: Secondary | ICD-10-CM

## 2023-07-07 DIAGNOSIS — R928 Other abnormal and inconclusive findings on diagnostic imaging of breast: Secondary | ICD-10-CM

## 2023-07-08 ENCOUNTER — Ambulatory Visit: Payer: Self-pay | Admitting: Nurse Practitioner

## 2023-07-08 DIAGNOSIS — M810 Age-related osteoporosis without current pathological fracture: Secondary | ICD-10-CM

## 2023-07-09 MED ORDER — ALENDRONATE SODIUM 70 MG PO TABS: 70.0000 mg | ORAL_TABLET | ORAL | 11 refills | Status: AC

## 2023-07-09 NOTE — Telephone Encounter (Signed)
-----   Message from [facility] sent at 07/08/2023  1:50 PM EDT ----- Called patient reviewed all information and repeated back to me.  Pt is willing to start on Fosamax.

## 2023-12-02 ENCOUNTER — Other Ambulatory Visit: Payer: Self-pay | Admitting: Nurse Practitioner

## 2023-12-02 DIAGNOSIS — Z1231 Encounter for screening mammogram for malignant neoplasm of breast: Secondary | ICD-10-CM

## 2024-01-27 ENCOUNTER — Ambulatory Visit
Admission: RE | Admit: 2024-01-27 | Discharge: 2024-01-27 | Disposition: A | Discharge status: Home / Self Care | Source: Ambulatory Visit | Attending: Nurse Practitioner | Admitting: Nurse Practitioner

## 2024-01-27 DIAGNOSIS — Z1231 Encounter for screening mammogram for malignant neoplasm of breast: Secondary | ICD-10-CM

## 2024-02-04 ENCOUNTER — Ambulatory Visit: Payer: Self-pay | Admitting: Nurse Practitioner
